# Patient Record
Sex: Male | Born: 1965 | Race: White | Hispanic: No | Smoking: Never smoker
Health system: Southern US, Community
[De-identification: ages and names within clinical notes are randomized; demographics above are authoritative.]

## PROBLEM LIST (undated history)

## (undated) HISTORY — PX: OTHER SURGICAL HISTORY: SHX169

## (undated) HISTORY — PX: HERNIA REPAIR: SHX51

## (undated) HISTORY — PX: CIRCUMCISION: SUR203

---

## 2002-11-05 ENCOUNTER — Ambulatory Visit (HOSPITAL_COMMUNITY): Admission: RE | Admit: 2002-11-05 | Discharge: 2002-11-05 | Payer: Self-pay | Admitting: General Surgery

## 2002-12-17 ENCOUNTER — Emergency Department (HOSPITAL_COMMUNITY): Admission: EM | Admit: 2002-12-17 | Discharge: 2002-12-17 | Payer: Self-pay | Admitting: Emergency Medicine

## 2007-02-06 ENCOUNTER — Emergency Department (HOSPITAL_COMMUNITY): Admission: EM | Admit: 2007-02-06 | Discharge: 2007-02-06 | Payer: Self-pay | Admitting: Emergency Medicine

## 2007-03-13 ENCOUNTER — Ambulatory Visit (HOSPITAL_COMMUNITY): Admission: RE | Admit: 2007-03-13 | Discharge: 2007-03-13 | Payer: Self-pay | Admitting: Family Medicine

## 2010-04-16 ENCOUNTER — Other Ambulatory Visit (HOSPITAL_COMMUNITY): Payer: Self-pay | Admitting: Family Medicine

## 2010-04-16 ENCOUNTER — Ambulatory Visit (HOSPITAL_COMMUNITY)
Admission: RE | Admit: 2010-04-16 | Discharge: 2010-04-16 | Disposition: A | Discharge status: Home / Self Care | Payer: BC Managed Care – PPO | Source: Ambulatory Visit | Attending: Family Medicine | Admitting: Family Medicine

## 2010-04-16 DIAGNOSIS — M25511 Pain in right shoulder: Secondary | ICD-10-CM

## 2010-04-16 DIAGNOSIS — M25519 Pain in unspecified shoulder: Secondary | ICD-10-CM | POA: Insufficient documentation

## 2010-04-16 DIAGNOSIS — M719 Bursopathy, unspecified: Secondary | ICD-10-CM | POA: Insufficient documentation

## 2010-04-16 DIAGNOSIS — M67919 Unspecified disorder of synovium and tendon, unspecified shoulder: Secondary | ICD-10-CM | POA: Insufficient documentation

## 2010-07-09 NOTE — H&P (Signed)
   NAME:  Jacob Marshall, Jacob Marshall NO.:  [id number]   MEDICAL RECORD NO.:  [id number]                  PATIENT TYPE:   LOCATION:                                       FACILITY:   PHYSICIAN:  Dalia Heading, M.D.               DATE OF BIRTH:  [birth year]   DATE OF ADMISSION:  DATE OF DISCHARGE:                                HISTORY & PHYSICAL   CHIEF COMPLAINT:  Change in bowel habits.   HISTORY OF PRESENT ILLNESS:  The patient is a 45 year old white male who is  referred for evaluation and treatment of change in bowel habits.  He has not  had this problem before.  He was constipated for approximately three weeks.  The stool caliber has decreased.  No nausea, vomiting, diarrhea, melena or  hematochezia have been noted.  There is no history of hemorrhoidal disease  or family history of colon carcinoma.   PAST MEDICAL HISTORY:  Extrinsic allergies.   PAST SURGICAL HISTORY:  1. Inguinal herniorrhaphy.  2. Right breast mass removal in the past.   MEDICATIONS:  1. Flonase.  2. Constulose 2 tablespoons p.o. q.h.s.   ALLERGIES:  No known drug allergies.   REVIEW OF SYSTEMS:  Noncontributory.   PHYSICAL EXAMINATION:  GENERAL APPEARANCE:  The patient is a well-developed,  well-nourished white male in no acute distress.  VITAL SIGNS:  He is afebrile, vital signs are stable.  LUNGS:  Clear to auscultation with good breath sounds bilaterally.  HEART:  Regular rate and rhythm without S3, S4 or murmurs.  ABDOMEN:  Soft, nontender and nondistended.  No hepatosplenomegaly, masses  or hernias identified.  RECTAL:  Deferred to the procedure.   IMPRESSION:  Change in bowel habits.    PLAN:  The patient is scheduled for a colonoscopy on November 05, 2002.  The risks and benefits of the procedure including bleeding and perforation  were fully explained to the patient, gained informed consent.                                                Dalia Heading,  M.D.    MAJ/MEDQ  D:  10/22/2002  T:  10/22/2002  Job:  [id number]   cc:   Mila Homer. Sudie Bailey, M.D.  [address] [place], [place] [address]  Fax: [phone]

## 2010-07-28 ENCOUNTER — Encounter: Payer: Self-pay | Admitting: Orthopedic Surgery

## 2010-07-28 ENCOUNTER — Ambulatory Visit (INDEPENDENT_AMBULATORY_CARE_PROVIDER_SITE_OTHER): Payer: BC Managed Care – PPO | Admitting: Orthopedic Surgery

## 2010-07-28 VITALS — Resp 20 | Ht 72.0 in | Wt 127.0 lb

## 2010-07-28 DIAGNOSIS — M958 Other specified acquired deformities of musculoskeletal system: Secondary | ICD-10-CM

## 2010-07-28 DIAGNOSIS — M2180 Other specified acquired deformities of unspecified limb: Secondary | ICD-10-CM

## 2010-07-28 NOTE — Progress Notes (Signed)
Gen. Appearance.  The patient was well-developed and well-nourished grooming and hygiene were normal  Orientation to person place and time normal, mood and affect normal.  Ambulation was normal.  Upper extremities pulses and temperature were normal without edema.  No lymphadenopathy.  Sensation was normal and there were no pathologic reflexes.  Coordination and balance were normal.

## 2010-07-28 NOTE — Progress Notes (Signed)
Note plain films reviewed with the report and they were negative  There was calcific tendinitis but the patient is not symptomatic in this area Subjective:    Jacob Marshall is a 45 y.o. male who presents with right shoulder pain. The symptoms began 1-1/2 years ago. Aggravating factors:sleeping on the RIGHT side.Pain is located in the upper RIGHT periscapular region associated with a feeling that the shoulder is dislocating and there is no is when he moves the arm. Discomfort is described as aching. Symptoms are exacerbated by lying on the shoulder. Evaluation to date: plain films: normal. Therapy to date includes: prednisone oral.  The following portions of the patient's history were reviewed and updated as appropriate: allergies, current medications, past family history, past medical history, past social history and past surgical history.  Review of Systems Gastrointestinal: positive for constipation   Objective:    Resp 20  Ht 6' (1.829 m)  Wt 127 lb (57.607 kg)  BMI 17.22 kg/m2 Right shoulder: normal active ROM, no tenderness, no impingement sign  Left shoulder: normal active ROM, no tenderness, no impingement sign    However, there is crepitance in winging of the RIGHT scapula. Assessment:    Right scapular winging and scapular thoracic bursitis    Plan:    Physical therapy referral.

## 2010-07-28 NOTE — Patient Instructions (Addendum)
Come back in 2 months after scapular winging program

## 2010-09-30 ENCOUNTER — Ambulatory Visit: Payer: BC Managed Care – PPO | Admitting: Orthopedic Surgery

## 2010-12-30 NOTE — H&P (Signed)
NTS SOAP Note  Vital Signs:  Vitals as of: 12/30/2010: Systolic 117: Diastolic 74: Heart Rate 74: Temp 98.54F: Height 72ft 10in: Weight 126Lbs 0 Ounces: Pain Level 1: BMI 18  BMI : 18.08 kg/m2  Subjective: This 45 Years 1 Months old Male presents for of change in bowel movements. Has noted recently to have smaller caliber stools. Associated with abdominal pain. No hematochezia noted. Thinks it may be related to a hernia.  Review of Symptoms:  Constitutional: unremarkable  Head: unremarkable  Eyes:unremarkable  sinus Cardiovascular:unremarkable  Respiratory: unremarkable  Gastrointestin abdominal pain,tenesmus Genitourinary: unremarkable  Musculoskeletal: unremarkable  rash, dry skin Hematolgic/Lymphatic: unremarkable  Allergic/Immunologic:unremarkable    Past Medical History:Reviewed  Past Medical History  Surgical History: Right inguinal hernia, right breast biopsy Medical Problems: none Allergies: nkda Medications: none   Social History: Reviewed   Social History  Preferred Language: English ([place]) Race: White Ethnicity: Not Hispanic / Latino Age: 45 Years 1 Months Marital Status: S Alcohol: No Recreational drug(s): No   Smoking Status: Never smoker reviewed on 12/30/2010  Family History: Reviewed  Family History  Is there a family history of:No family h/o colon cancer Mother: Cancer-stomach    Objective Information:  General: Well appearing, well nourished in no distress.  Head: Atraumatic; no masses; no abnormalities  Throat:  Neck: Supple without lymphadenopathy.  Lungs:CTA bilaterally, no wheezes, rhonchi, rales. Breathing unlabored.  Abdomen: Soft, NT/ND, no HSM, no masses. Weak bilateral inguinal floors, but no hernias appreciated.  GU: unremarkable  deferred to procedure  Assessment: Change in bowel habits, no hernias  Diagnosis & Procedure: DiagnosisCode: 787.99, ProcedureCode: 40981,    Plan:Scheduled for TCS on  01/04/11.   Patient Education: Alternative treatments to surgery were discussed with patient (and family).Risks and benefits of procedure were fully explained to the patient (and family) who gave informed consent. Patient/family questions were addressed.  Follow-up: Pending Surgery

## 2010-12-31 ENCOUNTER — Encounter (HOSPITAL_COMMUNITY): Payer: Self-pay | Admitting: Pharmacy Technician

## 2011-01-03 MED ORDER — SODIUM CHLORIDE 0.45 % IV SOLN
Freq: Once | INTRAVENOUS | Status: AC
  Administered 2011-01-04: 09:00:00 via INTRAVENOUS

## 2011-01-04 ENCOUNTER — Encounter (HOSPITAL_COMMUNITY)
Admission: RE | Disposition: A | Discharge status: Home / Self Care | Payer: Self-pay | Source: Ambulatory Visit | Attending: General Surgery

## 2011-01-04 ENCOUNTER — Ambulatory Visit (HOSPITAL_COMMUNITY)
Admission: RE | Admit: 2011-01-04 | Discharge: 2011-01-04 | Disposition: A | Discharge status: Home / Self Care | Payer: BC Managed Care – PPO | Source: Ambulatory Visit | Attending: General Surgery | Admitting: General Surgery

## 2011-01-04 ENCOUNTER — Encounter (HOSPITAL_COMMUNITY): Payer: Self-pay

## 2011-01-04 DIAGNOSIS — R198 Other specified symptoms and signs involving the digestive system and abdomen: Secondary | ICD-10-CM | POA: Insufficient documentation

## 2011-01-04 DIAGNOSIS — R109 Unspecified abdominal pain: Secondary | ICD-10-CM | POA: Insufficient documentation

## 2011-01-04 HISTORY — PX: COLONOSCOPY: SHX5424

## 2011-01-04 SURGERY — COLONOSCOPY
Anesthesia: Moderate Sedation

## 2011-01-04 MED ORDER — MIDAZOLAM HCL 5 MG/5ML IJ SOLN
INTRAMUSCULAR | Status: DC | PRN
  Administered 2011-01-04: 3 mg via INTRAVENOUS
  Administered 2011-01-04: 1 mg via INTRAVENOUS

## 2011-01-04 MED ORDER — STERILE WATER FOR IRRIGATION IR SOLN
Status: DC | PRN
  Administered 2011-01-04: 09:00:00

## 2011-01-04 MED ORDER — MEPERIDINE HCL 100 MG/ML IJ SOLN
INTRAMUSCULAR | Status: AC
  Filled 2011-01-04: qty 2

## 2011-01-04 MED ORDER — MEPERIDINE HCL 25 MG/ML IJ SOLN
INTRAMUSCULAR | Status: DC | PRN
  Administered 2011-01-04: 25 mg via INTRAVENOUS
  Administered 2011-01-04: 50 mg via INTRAVENOUS

## 2011-01-04 MED ORDER — MIDAZOLAM HCL 5 MG/5ML IJ SOLN
INTRAMUSCULAR | Status: AC
  Filled 2011-01-04: qty 10

## 2011-01-04 NOTE — Interval H&P Note (Signed)
History and Physical Interval Note:   01/04/2011   9:23 AM   Jacob Marshall  has presented today for surgery, with the diagnosis of Change in bowel habits [787.99]  The various methods of treatment have been discussed with the patient and family. After consideration of risks, benefits and other options for treatment, the patient has consented to  Procedure(s): COLONOSCOPY as a surgical intervention .  The patients' history has been reviewed, patient examined, no change in status, stable for surgery.  I have reviewed the patients' chart and labs.  Questions were answered to the patient's satisfaction.     Dalia Heading  MD

## 2011-01-12 ENCOUNTER — Encounter (HOSPITAL_COMMUNITY): Payer: Self-pay | Admitting: General Surgery

## 2016-02-17 ENCOUNTER — Other Ambulatory Visit (HOSPITAL_COMMUNITY): Payer: Self-pay | Admitting: Family Medicine

## 2016-02-17 DIAGNOSIS — N644 Mastodynia: Secondary | ICD-10-CM

## 2016-03-01 ENCOUNTER — Ambulatory Visit (HOSPITAL_COMMUNITY)
Admission: RE | Admit: 2016-03-01 | Discharge: 2016-03-01 | Disposition: A | Discharge status: Home / Self Care | Payer: BLUE CROSS/BLUE SHIELD | Source: Ambulatory Visit | Attending: Family Medicine | Admitting: Family Medicine

## 2016-03-01 DIAGNOSIS — N62 Hypertrophy of breast: Secondary | ICD-10-CM | POA: Insufficient documentation

## 2016-03-01 DIAGNOSIS — N644 Mastodynia: Secondary | ICD-10-CM | POA: Diagnosis not present

## 2017-08-21 ENCOUNTER — Emergency Department (HOSPITAL_COMMUNITY): Payer: Self-pay

## 2017-08-21 ENCOUNTER — Other Ambulatory Visit: Payer: Self-pay

## 2017-08-21 ENCOUNTER — Emergency Department (HOSPITAL_COMMUNITY)
Admission: EM | Admit: 2017-08-21 | Discharge: 2017-08-21 | Disposition: A | Discharge status: Home / Self Care | Payer: Self-pay | Attending: Emergency Medicine | Admitting: Emergency Medicine

## 2017-08-21 ENCOUNTER — Encounter (HOSPITAL_COMMUNITY): Payer: Self-pay | Admitting: Emergency Medicine

## 2017-08-21 DIAGNOSIS — R319 Hematuria, unspecified: Secondary | ICD-10-CM | POA: Insufficient documentation

## 2017-08-21 LAB — URINALYSIS, ROUTINE W REFLEX MICROSCOPIC
Bilirubin Urine: NEGATIVE
Glucose, UA: NEGATIVE mg/dL
Ketones, ur: NEGATIVE mg/dL
Leukocytes, UA: NEGATIVE
Nitrite: NEGATIVE
Protein, ur: 100 mg/dL — AB
RBC / HPF: >50 RBC/hpf — ABNORMAL HIGH (ref 0–5)
Specific Gravity, Urine: 1.023 (ref 1.005–1.030)
pH: 5 (ref 5.0–8.0)

## 2017-08-21 LAB — BASIC METABOLIC PANEL
Anion gap: 5 (ref 5–15)
BUN: 19 mg/dL (ref 6–20)
CHLORIDE: 104 mmol/L (ref 98–111)
CO2: 30 mmol/L (ref 22–32)
CREATININE: 0.88 mg/dL (ref 0.61–1.24)
Calcium: 9.3 mg/dL (ref 8.9–10.3)
Glucose, Bld: 107 mg/dL — ABNORMAL HIGH (ref 70–99)
POTASSIUM: 3.9 mmol/L (ref 3.5–5.1)
Sodium: 139 mmol/L (ref 135–145)

## 2017-08-21 LAB — CBC WITH DIFFERENTIAL/PLATELET
BASOS PCT: 0 %
Basophils Absolute: 0 10*3/uL (ref 0.0–0.1)
EOS ABS: 0.2 10*3/uL (ref 0.0–0.7)
EOS PCT: 3 %
HCT: 42.1 % (ref 39.0–52.0)
HEMOGLOBIN: 14.2 g/dL (ref 13.0–17.0)
Lymphocytes Relative: 22 %
Lymphs Abs: 1.3 10*3/uL (ref 0.7–4.0)
MCH: 31.1 pg (ref 26.0–34.0)
MCHC: 33.7 g/dL (ref 30.0–36.0)
MCV: 92.1 fL (ref 78.0–100.0)
Monocytes Absolute: 0.5 10*3/uL (ref 0.1–1.0)
Monocytes Relative: 8 %
NEUTROS PCT: 67 %
Neutro Abs: 3.9 10*3/uL (ref 1.7–7.7)
PLATELETS: 214 10*3/uL (ref 150–400)
RBC: 4.57 MIL/uL (ref 4.22–5.81)
RDW: 12.8 % (ref 11.5–15.5)
WBC: 5.8 10*3/uL (ref 4.0–10.5)

## 2017-08-21 MED ORDER — CEPHALEXIN 250 MG/5ML PO SUSR: 500.0000 mg | Freq: Four times a day (QID) | ORAL | 0 refills | Status: AC

## 2017-08-21 MED ORDER — CEPHALEXIN 500 MG PO CAPS
500.0000 mg | ORAL_CAPSULE | Freq: Once | ORAL | Status: DC
  Filled 2017-08-21: qty 1

## 2017-08-21 MED ORDER — CEPHALEXIN 250 MG/5ML PO SUSR
500.0000 mg | Freq: Once | ORAL | Status: AC
  Administered 2017-08-21: 500 mg via ORAL
  Filled 2017-08-21: qty 20

## 2017-08-21 MED ORDER — CEPHALEXIN 500 MG PO CAPS: 500.0000 mg | ORAL_CAPSULE | Freq: Four times a day (QID) | ORAL | 0 refills | Status: DC

## 2017-08-21 NOTE — ED Provider Notes (Signed)
[facility] EMERGENCY DEPARTMENT Provider Note   CSN: [id number] Arrival date & time: 08/21/17  1716     History   Chief Complaint Chief Complaint  Patient presents with  . Hematuria    HPI Jacob Marshall is a 52 y.o. male.  HPI   Jacob Marshall is a 52 y.o. male who presents to the Emergency Department complaining of hematuria.  Symptoms began several hours prior to arrival.  States that he noticed blood in his underwear post void, occurred on 2 separate instances.  He denies pain, burning with urination, fever, chills,  hesitancy or increased frequency, back or abdominal pain.  No penile discharge, testicular pain or swelling.  Denies previous kidney stones or similar symptoms.     History reviewed. No pertinent past medical history.  Patient Active Problem List   Diagnosis Date Noted  . Winging of scapula 07/28/2010    Past Surgical History:  Procedure Laterality Date  . CIRCUMCISION     at 52 years old  . COLONOSCOPY  01/04/2011   Procedure: COLONOSCOPY;  Surgeon: Dalia Heading;  Location: [facility];  Service: Gastroenterology;  Laterality: N/A;  . HERNIA REPAIR    . right breast mass          Home Medications    Prior to Admission medications   Medication Sig Start Date End Date Taking? Authorizing Provider  loratadine (CLARITIN) 10 MG tablet Take 10 mg by mouth daily as needed. Allergies     [provider]  Pseudoeph-Doxylamine-DM-APAP (NYQUIL) 60-7.07-20-998 MG/30ML LIQD Take 30 mLs by mouth at bedtime as needed. For cold     [provider]    Family History Family History  Problem Relation Age of Onset  . Lung disease Unknown   . Cancer Unknown     Social History Social History   Tobacco Use  . Smoking status: Never Smoker  . Smokeless tobacco: Never Used  Substance Use Topics  . Alcohol use: No  . Drug use: No     Allergies   Patient has no known allergies.   Review of Systems Review of Systems    Constitutional: Negative for chills, fatigue and fever.  Respiratory: Negative for shortness of breath and wheezing.   Cardiovascular: Negative for chest pain.  Gastrointestinal: Negative for abdominal pain, blood in stool, nausea and vomiting.  Genitourinary: Positive for hematuria. Negative for difficulty urinating, discharge, dysuria, flank pain, frequency, penile swelling, scrotal swelling and testicular pain.  Musculoskeletal: Negative for back pain and myalgias.  Skin: Negative for rash.  Neurological: Negative for dizziness, weakness and numbness.  Hematological: Does not bruise/bleed easily.     Physical Exam Updated Vital Signs BP (!) 145/88 (BP Location: Right Arm)   Pulse 84   Temp 98.2 F (36.8 C) (Oral)   Resp 18   SpO2 99%   Physical Exam  Constitutional: He is oriented to person, place, and time. He appears well-developed and well-nourished. No distress.  Cardiovascular: Normal rate, regular rhythm and intact distal pulses.  Pulmonary/Chest: Effort normal and breath sounds normal. No respiratory distress.  Abdominal: Soft. He exhibits no distension. There is no tenderness. There is no guarding.  Genitourinary: Testes normal and penis normal. No penile tenderness.  Musculoskeletal: Normal range of motion.  Neurological: He is alert and oriented to person, place, and time. No sensory deficit.  Skin: Skin is warm. Capillary refill takes less than 2 seconds. No rash noted.  Psychiatric: He has a normal mood and affect.  Nursing note and vitals reviewed.    ED Treatments / Results  Labs (all labs ordered are listed, but only abnormal results are displayed) Labs Reviewed  URINALYSIS, ROUTINE W REFLEX MICROSCOPIC - Abnormal; Notable for the following components:      Result Value   Color, Urine AMBER (*)    APPearance CLOUDY (*)    Hgb urine dipstick LARGE (*)    Protein, ur 100 (*)    RBC / HPF >50 (*)    Bacteria, UA RARE (*)    All other components within  normal limits  BASIC METABOLIC PANEL - Abnormal; Notable for the following components:   Glucose, Bld 107 (*)    All other components within normal limits  URINE CULTURE  CBC WITH DIFFERENTIAL/PLATELET    EKG None  Radiology Ct Renal Stone Study  Result Date: 08/21/2017 CLINICAL DATA:  Hematuria for 1 day. EXAM: CT ABDOMEN AND PELVIS WITHOUT CONTRAST TECHNIQUE: Multidetector CT imaging of the abdomen and pelvis was performed following the standard protocol without IV contrast. COMPARISON:  None. FINDINGS: Lower chest: The lung bases are clear. No pleural or pericardial effusion. Hepatobiliary: 0.7 cm hypoattenuating lesion in the posterior right hepatic lobe is likely a cyst. The liver is otherwise unremarkable. The gallbladder and biliary tree appear normal. Pancreas: Unremarkable. No pancreatic ductal dilatation or surrounding inflammatory changes. Spleen: Normal in size without focal abnormality. Adrenals/Urinary Tract: 2 nonobstructing right renal stones are identified. The larger measures 0.5 cm. There approximately 4 punctate nonobstructing left renal stones. No hydronephrosis on the right or left. No ureteral stone. A 0.3 cm in diameter calcification is seen in the left side of the bladder which appears to be either at or just beyond the left UVJ. Adrenal glands appear normal. Stomach/Bowel: Stomach is within normal limits. Appendix appears normal. No evidence of bowel wall thickening, distention, or inflammatory changes. Vascular/Lymphatic: No significant vascular findings are present. No enlarged abdominal or pelvic lymph nodes. Reproductive: Mild prostatomegaly is seen. Other: No lymphadenopathy or fluid. Musculoskeletal: Negative. IMPRESSION: 0.3 cm calcification in the left side of the urinary bladder is either at or just beyond the left ureterovesical junction. There is no hydronephrosis on the right or left. Nonobstructing bilateral renal stones. Mild prostatomegaly. Electronically Signed    By: Drusilla Kannerhomas  Dalessio M.D.   On: 08/21/2017 20:17    Procedures Procedures (including critical care time)  Medications Ordered in ED Medications  cephALEXin (KEFLEX) capsule 500 mg (has no administration in time range)     Initial Impression / Assessment and Plan / ED Course  I have reviewed the triage vital signs and the nursing notes.  Pertinent labs & imaging results that were available during my care of the patient were reviewed by me and considered in my medical decision making (see chart for details).     Pt well appearing, vitals reviewed.  Pt with several hours of painless hematuria.  CT neg for ureteral stones.  Exam reassuring.  Urine culture pending.  Will start keflex, pt agrees to close urology f/u.  Return precautions discussed.   Final Clinical Impressions(s) / ED Diagnoses   Final diagnoses:  Painless hematuria    ED Discharge Orders    None       Rosey Bathriplett, Leverne Tessler, PA-C 08/22/17 2147    Margarita Grizzleay, Danielle, MD 08/23/17 [id number]

## 2017-08-21 NOTE — Discharge Instructions (Addendum)
Take the antibiotic as directed until its finished.  Call the urologist listed to arrange a follow-up appointment.  Return to the ER for any worsening symptoms such as fever, vomiting, abdominal or flank pain

## 2017-08-21 NOTE — ED Triage Notes (Addendum)
Pt c/o hematuria x1 today. Pt denies pain. States did have to strain to have bm yesterday and doesn't know if that may have caused any bleeding.

## 2017-08-23 LAB — URINE CULTURE: Culture: <10000 — AB

## 2018-11-20 ENCOUNTER — Emergency Department (HOSPITAL_COMMUNITY): Payer: BC Managed Care – PPO

## 2018-11-20 ENCOUNTER — Emergency Department (HOSPITAL_COMMUNITY)
Admission: EM | Admit: 2018-11-20 | Discharge: 2018-11-20 | Disposition: A | Discharge status: Home / Self Care | Payer: BC Managed Care – PPO | Attending: Emergency Medicine | Admitting: Emergency Medicine

## 2018-11-20 ENCOUNTER — Encounter (HOSPITAL_COMMUNITY): Payer: Self-pay | Admitting: Emergency Medicine

## 2018-11-20 ENCOUNTER — Other Ambulatory Visit: Payer: Self-pay

## 2018-11-20 DIAGNOSIS — K5900 Constipation, unspecified: Secondary | ICD-10-CM | POA: Diagnosis not present

## 2018-11-20 DIAGNOSIS — R197 Diarrhea, unspecified: Secondary | ICD-10-CM | POA: Insufficient documentation

## 2018-11-20 DIAGNOSIS — R1031 Right lower quadrant pain: Secondary | ICD-10-CM | POA: Diagnosis not present

## 2018-11-20 DIAGNOSIS — R109 Unspecified abdominal pain: Secondary | ICD-10-CM | POA: Diagnosis not present

## 2018-11-20 LAB — URINALYSIS, ROUTINE W REFLEX MICROSCOPIC
Bilirubin Urine: NEGATIVE
Glucose, UA: NEGATIVE mg/dL
Ketones, ur: 5 mg/dL — AB
Leukocytes,Ua: NEGATIVE
Nitrite: NEGATIVE
Protein, ur: NEGATIVE mg/dL
Specific Gravity, Urine: 1.018 (ref 1.005–1.030)
pH: 6 (ref 5.0–8.0)

## 2018-11-20 LAB — CBC
HCT: 44.5 % (ref 39.0–52.0)
Hemoglobin: 14.6 g/dL (ref 13.0–17.0)
MCH: 30.4 pg (ref 26.0–34.0)
MCHC: 32.8 g/dL (ref 30.0–36.0)
MCV: 92.7 fL (ref 80.0–100.0)
Platelets: 233 10*3/uL (ref 150–400)
RBC: 4.8 MIL/uL (ref 4.22–5.81)
RDW: 12.3 % (ref 11.5–15.5)
WBC: 4.8 10*3/uL (ref 4.0–10.5)
nRBC: 0 % (ref 0.0–0.2)

## 2018-11-20 LAB — COMPREHENSIVE METABOLIC PANEL
ALT: 18 U/L (ref 0–44)
AST: 18 U/L (ref 15–41)
Albumin: 4.3 g/dL (ref 3.5–5.0)
Alkaline Phosphatase: 33 U/L — ABNORMAL LOW (ref 38–126)
Anion gap: 8 (ref 5–15)
BUN: 18 mg/dL (ref 6–20)
CO2: 27 mmol/L (ref 22–32)
Calcium: 9.2 mg/dL (ref 8.9–10.3)
Chloride: 102 mmol/L (ref 98–111)
Creatinine, Ser: 0.96 mg/dL (ref 0.61–1.24)
GFR calc Af Amer: >60 mL/min (ref >60)
GFR calc non Af Amer: >60 mL/min (ref >60)
Glucose, Bld: 107 mg/dL — ABNORMAL HIGH (ref 70–99)
Potassium: 4.3 mmol/L (ref 3.5–5.1)
Sodium: 137 mmol/L (ref 135–145)
Total Bilirubin: 0.7 mg/dL (ref 0.3–1.2)
Total Protein: 7.2 g/dL (ref 6.5–8.1)

## 2018-11-20 LAB — LIPASE, BLOOD: Lipase: 38 U/L (ref 11–51)

## 2018-11-20 MED ORDER — SODIUM CHLORIDE 0.9% FLUSH: 3.0000 mL | Freq: Once | INTRAVENOUS | Status: DC

## 2018-11-20 MED ORDER — SORBITOL 70 % SOLN
960.0000 mL | TOPICAL_OIL | Freq: Once | ORAL | Status: AC
  Administered 2018-11-20: 960 mL via RECTAL
  Filled 2018-11-20 (×2): qty 473

## 2018-11-20 NOTE — Discharge Instructions (Addendum)
You can take miralax, available over the counter for constipation.

## 2018-11-20 NOTE — ED Triage Notes (Signed)
Pt states for the last week he has felt constipated and having RLQ pain. Pt states the last BM he had yesterday was a thin bm and not normal.

## 2018-11-20 NOTE — ED Provider Notes (Signed)
[facility] EMERGENCY DEPARTMENT Provider Note   CSN: [id number] Arrival date & time: 11/20/18  6644     History   Chief Complaint Chief Complaint  Patient presents with  . Constipation    HPI Jacob Marshall is a 53 y.o. male.     The history is provided by the patient and medical records. No language interpreter was used.  Constipation  Jacob Marshall is a 53 y.o. male who presents to the Emergency Department complaining of constipation. He presents to the emergency department for evaluation of constipation and right lower quadrant abdominal pain. He has a history of chronic constipation, for which she occasionally takes Dulcolax. Over the last month he has experienced progressive of right sided abdominal bloating and discomfort. He feels like there is a blockage in this area. He has worsening constipation over the last week. He has taken Dulcolax twice. After one dose he had liquid stool and yesterday he had a very small caliber stool. He denies any fevers, nausea, vomiting. He has a history of right inguinal hernia repair. He has had to prior colonoscopies by Dr. Arnoldo Morale in [place] but does not have a gastroenterologist. Symptoms are mild to moderate and constant nature. He is planning a trip to [place] next week and wanted to get checked out before going. History reviewed. No pertinent past medical history.  Patient Active Problem List   Diagnosis Date Noted  . Winging of scapula 07/28/2010    Past Surgical History:  Procedure Laterality Date  . CIRCUMCISION     at 53 years old  . COLONOSCOPY  01/04/2011   Procedure: COLONOSCOPY;  Surgeon: Jamesetta So;  Location: [facility];  Service: Gastroenterology;  Laterality: N/A;  . HERNIA REPAIR    . right breast mass          Home Medications    Prior to Admission medications   Medication Sig Start Date End Date Taking? Authorizing Provider  OVER THE COUNTER MEDICATION daily as needed (allergies).  Children's Allergy Medicine   Yes [provider]    Family History Family History  Problem Relation Age of Onset  . Lung disease Other   . Cancer Other     Social History Social History   Tobacco Use  . Smoking status: Never Smoker  . Smokeless tobacco: Never Used  Substance Use Topics  . Alcohol use: No  . Drug use: No     Allergies   Patient has no known allergies.   Review of Systems Review of Systems  Gastrointestinal: Positive for constipation.  All other systems reviewed and are negative.    Physical Exam Updated Vital Signs BP 129/84   Pulse 71   Temp 98 F (36.7 C) (Oral)   Resp 14   SpO2 100%   Physical Exam Vitals signs and nursing note reviewed.  Constitutional:      Appearance: He is well-developed.  HENT:     Head: Normocephalic and atraumatic.  Cardiovascular:     Rate and Rhythm: Normal rate and regular rhythm.  Pulmonary:     Effort: Pulmonary effort is normal. No respiratory distress.  Abdominal:     General: There is no distension.     Palpations: Abdomen is soft. There is no mass.     Tenderness: There is no abdominal tenderness. There is no guarding or rebound.     Hernia: No hernia is present.  Musculoskeletal:        General: No swelling or  tenderness.  Skin:    General: Skin is warm and dry.     Capillary Refill: Capillary refill takes less than 2 seconds.  Neurological:     Mental Status: He is alert and oriented to person, place, and time.  Psychiatric:        Behavior: Behavior normal.      ED Treatments / Results  Labs (all labs ordered are listed, but only abnormal results are displayed) Labs Reviewed  COMPREHENSIVE METABOLIC PANEL - Abnormal; Notable for the following components:      Result Value   Glucose, Bld 107 (*)    Alkaline Phosphatase 33 (*)    All other components within normal limits  URINALYSIS, ROUTINE W REFLEX MICROSCOPIC - Abnormal; Notable for the following components:   Hgb urine  dipstick SMALL (*)    Ketones, ur 5 (*)    Bacteria, UA RARE (*)    All other components within normal limits  LIPASE, BLOOD  CBC    EKG None  Radiology Dg Abdomen 1 View  Result Date: 11/20/2018 CLINICAL DATA:  Abdominal pain and constipation for 1 month. EXAM: ABDOMEN - 1 VIEW COMPARISON:  None. FINDINGS: The bowel gas pattern is normal. No radio-opaque calculi or other significant radiographic abnormality are seen. IMPRESSION: Negative. Electronically Signed   By: Harmon Pier M.D.   On: 11/20/2018 13:19    Procedures Procedures (including critical care time)  Medications Ordered in ED Medications  sodium chloride flush (NS) 0.9 % injection 3 mL (3 mLs Intravenous Not Given 11/20/18 1314)  sorbitol, milk of mag, mineral oil, glycerin (SMOG) enema (960 mLs Rectal Given 11/20/18 1454)     Initial Impression / Assessment and Plan / ED Course  I have reviewed the triage vital signs and the nursing notes.  Pertinent labs & imaging results that were available during my care of the patient were reviewed by me and considered in my medical decision making (see chart for details).        Patient here for evaluation of right sided abdominal discomfort for the last month as well as acute on chronic constipation. He is no evidence of bowel obstruction based on history and examination. Presentation is not consistent with appendicitis. Following enema in the emergency department he had multiple bowel movements and is feeling improved. Discussed with patient home care for constipation. Discussed outpatient follow-up and return precautions.  Final Clinical Impressions(s) / ED Diagnoses   Final diagnoses:  Constipation, unspecified constipation type    ED Discharge Orders    None       Tilden Fossa, MD 11/20/18 [phone]

## 2019-01-01 ENCOUNTER — Other Ambulatory Visit: Payer: Self-pay

## 2019-01-01 DIAGNOSIS — Z20822 Contact with and (suspected) exposure to covid-19: Secondary | ICD-10-CM

## 2019-01-02 ENCOUNTER — Telehealth: Payer: Self-pay | Admitting: *Deleted

## 2019-01-02 LAB — NOVEL CORONAVIRUS, NAA: SARS-CoV-2, NAA: NOT DETECTED

## 2019-01-02 NOTE — Telephone Encounter (Signed)
Seeking covid19 results. None available as of yet.

## 2019-01-03 NOTE — Telephone Encounter (Signed)
Pt given result of COVID test; he verbalized understanding. 

## 2019-08-28 DIAGNOSIS — K5900 Constipation, unspecified: Secondary | ICD-10-CM | POA: Diagnosis not present

## 2019-08-28 DIAGNOSIS — R202 Paresthesia of skin: Secondary | ICD-10-CM | POA: Diagnosis not present

## 2019-08-28 DIAGNOSIS — C44612 Basal cell carcinoma of skin of right upper limb, including shoulder: Secondary | ICD-10-CM | POA: Diagnosis not present

## 2019-09-04 DIAGNOSIS — Z0000 Encounter for general adult medical examination without abnormal findings: Secondary | ICD-10-CM | POA: Diagnosis not present

## 2019-09-04 DIAGNOSIS — E785 Hyperlipidemia, unspecified: Secondary | ICD-10-CM | POA: Diagnosis not present

## 2019-09-11 DIAGNOSIS — R203 Hyperesthesia: Secondary | ICD-10-CM | POA: Diagnosis not present

## 2019-09-11 DIAGNOSIS — Z0001 Encounter for general adult medical examination with abnormal findings: Secondary | ICD-10-CM | POA: Diagnosis not present

## 2019-09-13 ENCOUNTER — Other Ambulatory Visit (HOSPITAL_COMMUNITY): Payer: Self-pay | Admitting: Internal Medicine

## 2019-09-13 ENCOUNTER — Other Ambulatory Visit: Payer: Self-pay | Admitting: Internal Medicine

## 2019-09-13 DIAGNOSIS — I739 Peripheral vascular disease, unspecified: Secondary | ICD-10-CM

## 2019-10-29 DIAGNOSIS — K219 Gastro-esophageal reflux disease without esophagitis: Secondary | ICD-10-CM | POA: Diagnosis not present

## 2019-10-29 DIAGNOSIS — R10813 Right lower quadrant abdominal tenderness: Secondary | ICD-10-CM | POA: Diagnosis not present

## 2019-10-29 DIAGNOSIS — Z1211 Encounter for screening for malignant neoplasm of colon: Secondary | ICD-10-CM | POA: Diagnosis not present

## 2019-10-29 DIAGNOSIS — K5900 Constipation, unspecified: Secondary | ICD-10-CM | POA: Diagnosis not present

## 2020-07-15 DIAGNOSIS — L308 Other specified dermatitis: Secondary | ICD-10-CM | POA: Diagnosis not present

## 2020-07-15 DIAGNOSIS — B078 Other viral warts: Secondary | ICD-10-CM | POA: Diagnosis not present

## 2020-07-15 DIAGNOSIS — L821 Other seborrheic keratosis: Secondary | ICD-10-CM | POA: Diagnosis not present

## 2020-07-26 DIAGNOSIS — Z20822 Contact with and (suspected) exposure to covid-19: Secondary | ICD-10-CM | POA: Diagnosis not present

## 2020-09-16 DIAGNOSIS — E782 Mixed hyperlipidemia: Secondary | ICD-10-CM | POA: Diagnosis not present

## 2020-09-16 DIAGNOSIS — Z0000 Encounter for general adult medical examination without abnormal findings: Secondary | ICD-10-CM | POA: Diagnosis not present

## 2020-09-23 DIAGNOSIS — Z0001 Encounter for general adult medical examination with abnormal findings: Secondary | ICD-10-CM | POA: Diagnosis not present

## 2021-01-28 ENCOUNTER — Other Ambulatory Visit: Payer: Self-pay

## 2021-01-28 ENCOUNTER — Emergency Department (HOSPITAL_COMMUNITY)
Admission: EM | Admit: 2021-01-28 | Discharge: 2021-01-28 | Disposition: A | Discharge status: Home / Self Care | Payer: BC Managed Care – PPO | Attending: Emergency Medicine | Admitting: Emergency Medicine

## 2021-01-28 ENCOUNTER — Emergency Department (HOSPITAL_COMMUNITY): Payer: BC Managed Care – PPO

## 2021-01-28 ENCOUNTER — Encounter (HOSPITAL_COMMUNITY): Payer: Self-pay | Admitting: Emergency Medicine

## 2021-01-28 DIAGNOSIS — R079 Chest pain, unspecified: Secondary | ICD-10-CM | POA: Diagnosis not present

## 2021-01-28 DIAGNOSIS — R0602 Shortness of breath: Secondary | ICD-10-CM | POA: Insufficient documentation

## 2021-01-28 DIAGNOSIS — R0789 Other chest pain: Secondary | ICD-10-CM | POA: Insufficient documentation

## 2021-01-28 LAB — CBC WITH DIFFERENTIAL/PLATELET
Abs Immature Granulocytes: 0.01 10*3/uL (ref 0.00–0.07)
Basophils Absolute: 0.1 10*3/uL (ref 0.0–0.1)
Basophils Relative: 1 %
Eosinophils Absolute: 0.2 10*3/uL (ref 0.0–0.5)
Eosinophils Relative: 4 %
HCT: 42.7 % (ref 39.0–52.0)
Hemoglobin: 14.4 g/dL (ref 13.0–17.0)
Immature Granulocytes: 0 %
Lymphocytes Relative: 27 %
Lymphs Abs: 1.6 10*3/uL (ref 0.7–4.0)
MCH: 31.4 pg (ref 26.0–34.0)
MCHC: 33.7 g/dL (ref 30.0–36.0)
MCV: 93.2 fL (ref 80.0–100.0)
Monocytes Absolute: 0.4 10*3/uL (ref 0.1–1.0)
Monocytes Relative: 8 %
Neutro Abs: 3.5 10*3/uL (ref 1.7–7.7)
Neutrophils Relative %: 60 %
Platelets: 277 10*3/uL (ref 150–400)
RBC: 4.58 MIL/uL (ref 4.22–5.81)
RDW: 12.6 % (ref 11.5–15.5)
WBC: 5.8 10*3/uL (ref 4.0–10.5)
nRBC: 0 % (ref 0.0–0.2)

## 2021-01-28 LAB — COMPREHENSIVE METABOLIC PANEL
ALT: 20 U/L (ref 0–44)
AST: 19 U/L (ref 15–41)
Albumin: 4.6 g/dL (ref 3.5–5.0)
Alkaline Phosphatase: 36 U/L — ABNORMAL LOW (ref 38–126)
Anion gap: 8 (ref 5–15)
BUN: 19 mg/dL (ref 6–20)
CO2: 30 mmol/L (ref 22–32)
Calcium: 9.5 mg/dL (ref 8.9–10.3)
Chloride: 101 mmol/L (ref 98–111)
Creatinine, Ser: 0.81 mg/dL (ref 0.61–1.24)
GFR, Estimated: >60 mL/min (ref >60)
Glucose, Bld: 101 mg/dL — ABNORMAL HIGH (ref 70–99)
Potassium: 3.2 mmol/L — ABNORMAL LOW (ref 3.5–5.1)
Sodium: 139 mmol/L (ref 135–145)
Total Bilirubin: 0.4 mg/dL (ref 0.3–1.2)
Total Protein: 7.5 g/dL (ref 6.5–8.1)

## 2021-01-28 LAB — TROPONIN I (HIGH SENSITIVITY)
Troponin I (High Sensitivity): <2 ng/L (ref <18)
Troponin I (High Sensitivity): <2 ng/L (ref <18)

## 2021-01-28 MED ORDER — ASPIRIN 325 MG PO TABS
325.0000 mg | ORAL_TABLET | Freq: Every day | ORAL | Status: DC
  Filled 2021-01-28: qty 1

## 2021-01-28 MED ORDER — ASPIRIN 81 MG PO CHEW
324.0000 mg | CHEWABLE_TABLET | Freq: Every day | ORAL | Status: DC
  Administered 2021-01-28: 324 mg via ORAL
  Filled 2021-01-28: qty 4

## 2021-01-28 MED ORDER — POTASSIUM CHLORIDE 20 MEQ PO PACK
40.0000 meq | PACK | Freq: Once | ORAL | Status: AC
  Administered 2021-01-28: 40 meq via ORAL
  Filled 2021-01-28: qty 2

## 2021-01-28 MED ORDER — POTASSIUM CHLORIDE CRYS ER 20 MEQ PO TBCR
40.0000 meq | EXTENDED_RELEASE_TABLET | Freq: Once | ORAL | Status: DC
  Filled 2021-01-28: qty 2

## 2021-01-28 NOTE — Discharge Instructions (Addendum)
You have been seen and discharged from the emergency department.  Your cardiac work-up was normal.  However you should still establish care and follow-up with a cardiologist for further nonemergent cardiac testing.  Your potassium level was just slightly low today, you are given oral replacement in the department.  Follow-up with your primary provider for repeat blood work, reevaluation and further care. Take home medications as prescribed. If you have any worsening symptoms, severe chest pain, difficulty breathing or further concerns for your health please return to an emergency department for further evaluation.

## 2021-01-28 NOTE — ED Provider Notes (Signed)
[facility] EMERGENCY DEPARTMENT Provider Note   CSN: [id number] Arrival date & time: 01/28/21  1512     History Chief Complaint  Patient presents with   Chest Pain    Jacob Marshall is a 55 y.o. male.  HPI  55 year old male presents emergency department with concern for left-sided chest discomfort that radiates into his left arm.  Patient states that this has been intermittent for the past 3 days.  Not always exertional.  Relieved when he lays down and rest at night.  He is a non-smoker, no history of hypertension or hyperlipidemia.  Patient describes the discomfort as a nagging like feeling close to his left armpit.  Does not take aspirin daily.  Patient sings as a pastime and has noticed that when he sings he has had some shortness of breath.  No current shortness of breath. Denies any swelling of his lower extremities.  No recent traveling or risk factors for DVT/PE.  Denies any recent fever, cough or illness.  History reviewed. No pertinent past medical history.  Patient Active Problem List   Diagnosis Date Noted   Winging of scapula 07/28/2010    Past Surgical History:  Procedure Laterality Date   CIRCUMCISION     at 55 years old   COLONOSCOPY  01/04/2011   Procedure: COLONOSCOPY;  Surgeon: Jamesetta So;  [facility];  Service: Gastroenterology;  Laterality: N/A;   HERNIA REPAIR     right breast mass         Family History  Problem Relation Age of Onset   Lung disease Other    Cancer Other     Social History   Tobacco Use   Smoking status: Never   Smokeless tobacco: Never  Substance Use Topics   Alcohol use: No   Drug use: No    Home Medications Prior to Admission medications   Medication Sig Start Date End Date Taking? Authorizing Provider  betamethasone dipropionate 0.05 % cream Apply 1 application topically 2 (two) times daily.   Yes [provider]  Chlorphen-Phenyleph-ASA (ALKA-SELTZER PLUS COLD PO) Take 2 tablets by mouth  daily as needed (sinus).   Yes [provider]  dicyclomine (BENTYL) 10 MG/5ML solution Take 20 mg by mouth 4 (four) times daily. 11/04/19  Yes [provider]  diphenhydrAMINE (BENADRYL) 12.5 MG/5ML elixir Take by mouth 4 (four) times daily as needed for allergies. Take 30 ml by mouth as needed for allergies   Yes [provider]  OVER THE COUNTER MEDICATION daily as needed (allergies). Children's Allergy Medicine   Yes [provider]  cetirizine (ZYRTEC) 10 MG tablet 1 tablet as needed Patient not taking: Reported on 01/28/2021    [provider]  lansoprazole (PREVACID SOLUTAB) 30 MG disintegrating tablet 1 tablet Patient not taking: Reported on 01/28/2021 10/29/19   [provider]    Allergies    Patient has no known allergies.  Review of Systems   Review of Systems  Constitutional:  Negative for chills, fatigue and fever.  HENT:  Negative for congestion.   Eyes:  Negative for visual disturbance.  Respiratory:  Positive for chest tightness and shortness of breath. Negative for cough.   Cardiovascular:  Positive for chest pain. Negative for palpitations and leg swelling.  Gastrointestinal:  Negative for abdominal pain, diarrhea and vomiting.  Genitourinary:  Negative for dysuria.  Musculoskeletal:  Negative for back pain.  Skin:  Negative for rash.  Neurological:  Negative for headaches.   Physical  Exam Updated Vital Signs BP (!) 148/90 (BP Location: Right Arm)   Temp 98.2 F (36.8 C) (Oral)   Resp 18   Ht 6' (1.829 m)   Wt 59 kg   SpO2 100%   BMI 17.63 kg/m   Physical Exam Vitals and nursing note reviewed.  Constitutional:      General: He is not in acute distress.    Appearance: Normal appearance. He is not diaphoretic.  HENT:     Head: Normocephalic.     Mouth/Throat:     Mouth: Mucous membranes are moist.  Cardiovascular:     Rate and Rhythm: Normal rate.  Pulmonary:     Effort: Pulmonary effort is normal. No  respiratory distress.  Abdominal:     Palpations: Abdomen is soft.     Tenderness: There is no abdominal tenderness.  Skin:    General: Skin is warm.  Neurological:     Mental Status: He is alert and oriented to person, place, and time. Mental status is at baseline.  Psychiatric:        Mood and Affect: Mood normal.    ED Results / Procedures / Treatments   Labs (all labs ordered are listed, but only abnormal results are displayed) Labs Reviewed  COMPREHENSIVE METABOLIC PANEL - Abnormal; Notable for the following components:      Result Value   Potassium 3.2 (*)    Glucose, Bld 101 (*)    Alkaline Phosphatase 36 (*)    All other components within normal limits  CBC WITH DIFFERENTIAL/PLATELET  TROPONIN I (HIGH SENSITIVITY)  TROPONIN I (HIGH SENSITIVITY)    EKG EKG Interpretation  Date/Time:  Thursday January 28 2021 15:39:35 EST Ventricular Rate:  85 PR Interval:  130 QRS Duration: 95 QT Interval:  381 QTC Calculation: 453 R Axis:   91 Text Interpretation: Sinus rhythm Borderline right axis deviation Nonspecific T abnrm, anterolateral leads NSR, no STEMI Confirmed by Lavenia Atlas [id number]) on 01/28/2021 3:43:05 PM  Radiology DG Chest 2 View  Result Date: 01/28/2021 CLINICAL DATA:  Three days of chest pain EXAM: CHEST - 2 VIEW COMPARISON:  March 13, 2007 FINDINGS: The heart size and mediastinal contours are within normal limits. No focal consolidation. No pleural effusion. No pneumothorax. The visualized skeletal structures are unremarkable. IMPRESSION: No active cardiopulmonary disease. Electronically Signed   By: Dahlia Bailiff M.D.   On: 01/28/2021 16:05    Procedures Procedures   Medications Ordered in ED Medications  aspirin chewable tablet 324 mg (324 mg Oral Given 01/28/21 1549)    ED Course  I have reviewed the triage vital signs and the nursing notes.  Pertinent labs & imaging results that were available during my care of the patient were reviewed by me  and considered in my medical decision making (see chart for details).    MDM Rules/Calculators/A&P                           55 year old male presents emergency department with an achiness in his left chest/armpit area.  This is intermittent, self resolved, nonexertional.  He also has noted some mild shortness of breath when he sings currently while laying in bed he is symptom-free.  Well-appearing.  No significant cardiac history.  EKG is normal sinus rhythm, nonspecific T wave changes but no acute ischemic changes.  No previous to compare to.  Chest x-ray is unremarkable.  Blood work is reassuring, mild hypokalemia.  Troponin is less than 2,  twice with no delta.  Patient's symptoms sound atypical for ACS, work-up is reassuring.  Low heart score of 2.  Lower suspicion for PE/dissection given current presentation and absence of symptoms.  Plan for outpatient cardiology follow-up and nonemergent testing with strict return to ED precautions.  Patient at this time appears safe and stable for discharge and will be treated as an outpatient.  Discharge plan and strict return to ED precautions discussed, patient verbalizes understanding and agreement.  Final Clinical Impression(s) / ED Diagnoses Final diagnoses:  None    Rx / DC Orders ED Discharge Orders     None        Rozelle Logan, DO 01/28/21 2051

## 2021-01-28 NOTE — ED Triage Notes (Signed)
CP and L arm radiation, SOB x 3days. Family h/o MI at early age (uncle 17), nonsmoker, no blood thinner. Denies HLD or HTN. CP is intermittent, better at night, worse with exertion, describes as "nagging". Has not taken nitro or ASA, does not have this prescribed to him

## 2021-02-04 ENCOUNTER — Other Ambulatory Visit: Payer: Self-pay

## 2021-02-04 ENCOUNTER — Telehealth (HOSPITAL_COMMUNITY): Payer: Self-pay | Admitting: Emergency Medicine

## 2021-02-04 ENCOUNTER — Ambulatory Visit (HOSPITAL_COMMUNITY)
Admission: RE | Admit: 2021-02-04 | Discharge: 2021-02-04 | Disposition: A | Discharge status: Home / Self Care | Payer: BC Managed Care – PPO | Source: Ambulatory Visit | Attending: Cardiology | Admitting: Cardiology

## 2021-02-04 ENCOUNTER — Encounter: Payer: Self-pay | Admitting: Cardiology

## 2021-02-04 ENCOUNTER — Ambulatory Visit (INDEPENDENT_AMBULATORY_CARE_PROVIDER_SITE_OTHER): Payer: BC Managed Care – PPO | Admitting: Cardiology

## 2021-02-04 VITALS — BP 110/68 | HR 66 | Ht 72.0 in | Wt 136.8 lb

## 2021-02-04 DIAGNOSIS — R079 Chest pain, unspecified: Secondary | ICD-10-CM

## 2021-02-04 MED ORDER — IOHEXOL 350 MG/ML SOLN
100.0000 mL | Freq: Once | INTRAVENOUS | Status: AC | PRN
  Administered 2021-02-04: 100 mL via INTRAVENOUS

## 2021-02-04 MED ORDER — METOPROLOL TARTRATE 5 MG/5ML IV SOLN
INTRAVENOUS | Status: AC
  Administered 2021-02-04: 10 mg
  Filled 2021-02-04: qty 10

## 2021-02-04 MED ORDER — NITROGLYCERIN 0.4 MG SL SUBL
SUBLINGUAL_TABLET | SUBLINGUAL | Status: AC
  Administered 2021-02-04: 0.8 mg
  Filled 2021-02-04: qty 2

## 2021-02-04 MED ORDER — METOPROLOL TARTRATE 100 MG PO TABS: ORAL_TABLET | ORAL | 0 refills | Status: DC

## 2021-02-04 MED ORDER — METOPROLOL TARTRATE 5 MG/5ML IV SOLN
INTRAVENOUS | Status: AC
  Filled 2021-02-04: qty 5

## 2021-02-04 NOTE — Progress Notes (Signed)
Cardiology Office Note   Date:  02/04/2021   ID:  [name], DOB [birth year], MRN [id number]  PCP:  Benita Stabile, MD  Cardiologist:   Rollene Rotunda, MD Referring:  Benita Stabile, MD  Chief Complaint  Patient presents with   Chest Pain       History of Present Illness: Jacob Marshall is a 55 y.o. male who presents for evaluation of chest pain . He is referred by Benita Stabile, MD  He was in the ED on 12/8 with chest pain.  I reviewed these records for this visit.  There was no objective evidence of ischemia.  Troponin x2 was unremarkable.  EKG demonstrated no significant arrhythmias or ischemic changes.  He says he has been getting chest pain for 2 weeks.  He reports that this is under his left chest and under his left arm.  It comes and goes more with emotional stress.  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  He says it is 5 out of 10.  He goes away if he gets a lot of stress.  He really is not physically active is much to bring on but he does not notice it walking across the shop.  He denies any associated nausea vomiting or diaphoresis.  He might have some mild shortness of breath.  He has not had any palpitations, presyncope or syncope.  He has never had any prior cardiac work-up.  He has not have significant cardiovascular risk factors.   PMH:  None  Past Surgical History:  Procedure Laterality Date   CIRCUMCISION     at 55 years old   COLONOSCOPY  01/04/2011   Procedure: COLONOSCOPY;  Surgeon: Dalia Heading;  [facility];  Service: Gastroenterology;  Laterality: N/A;   HERNIA REPAIR Right    Inguinal   right breast mass       Current Outpatient Medications  Medication Sig Dispense Refill   betamethasone dipropionate 0.05 % cream Apply 1 application topically 2 (two) times daily.     cetirizine (ZYRTEC) 10 MG tablet      Chlorphen-Phenyleph-ASA (ALKA-SELTZER PLUS COLD PO)  Take 2 tablets by mouth daily as needed (sinus).     dicyclomine (BENTYL) 10 MG/5ML solution Take 20 mg by mouth 4 (four) times daily.     diphenhydrAMINE (BENADRYL) 12.5 MG/5ML elixir Take by mouth 4 (four) times daily as needed for allergies. Take 30 ml by mouth as needed for allergies     lansoprazole (PREVACID SOLUTAB) 30 MG disintegrating tablet      OVER THE COUNTER MEDICATION daily as needed (allergies). Children[facility] Allergy Medicine     No current facility-administered medications for this visit.    Allergies:   Patient has no known allergies.    Social History:  The patient  reports that he has never smoked. He has never used smokeless tobacco. He reports that he does not drink alcohol and does not use drugs.   Family History:  The patient's family history includes COPD in his father and sister; Cancer in an other family member; Emphysema in his father; Heart failure in his sister; Lung disease in an other family member; Stomach cancer in his mother; Stroke (age of onset: 79) in his brother.    ROS:  Please see the history of present illness.   Otherwise, review of systems are positive for constipation.  All other systems are reviewed and negative.    PHYSICAL EXAM: VS:  BP 110/68    Pulse 66    Ht 6' (1.829 m)    Wt 136 lb 12.8 oz (62.1 kg)    SpO2 98%    BMI 18.55 kg/m  , BMI Body mass index is 18.55 kg/m.  Blood pressure was equal in both arms GENERAL:  Well appearing.  Thin HEENT:  Pupils equal round and reactive, fundi not visualized, oral mucosa unremarkable NECK:  No jugular venous distention, waveform within normal limits, carotid upstroke brisk and symmetric, no bruits, no thyromegaly LYMPHATICS:  No cervical, inguinal adenopathy LUNGS:  Clear to auscultation bilaterally BACK:  No CVA tenderness CHEST:  Unremarkable HEART:  PMI not displaced or sustained,S1 and S2 within normal limits, no S3, no S4, no clicks, no rubs, no murmurs ABD:  Flat, positive bowel sounds  normal in frequency in pitch, no bruits, no rebound, no guarding, no midline pulsatile mass, no hepatomegaly, no splenomegaly EXT:  2 plus pulses throughout, no edema, no cyanosis no clubbing SKIN:  No rashes no nodules NEURO:  Cranial nerves II through XII grossly intact, motor grossly intact throughout PSYCH:  Cognitively intact, oriented to person place and time    EKG:  EKG is ordered today. The ekg ordered today demonstrates sinus rhythm, rate 66, axis within normal limits, intervals within normal limits, no acute ST-T wave changes.   Recent Labs: 01/28/2021: ALT 20; BUN 19; Creatinine, Ser 0.81; Hemoglobin 14.4; Platelets 277; Potassium 3.2; Sodium 139    Lipid Panel No results found for: CHOL, TRIG, HDL, CHOLHDL, VLDL, LDLCALC, LDLDIRECT    Wt Readings from Last 3 Encounters:  02/04/21 136 lb 12.8 oz (62.1 kg)  01/28/21 130 lb (59 kg)  01/04/11 127 lb (57.6 kg)      Other studies Reviewed: Additional studies/ records that were reviewed today include: ED records. Review of the above records demonstrates:  Please see elsewhere in the note.     ASSESSMENT AND PLAN:  Precordial chest pain: His chest pain has nonanginal but also some anginal features.  I think the pretest probability of obstructive coronary disease is moderate.  I would also like to visualize his aorta.  This is likely noncardiac chest discomfort but we need to screen him.  Hypokalemia: This was supplemented in the emergency room.  He could get this repeated by his primary care physician soon.  Risk reduction: He should have a lipid profile.   Current medicines are reviewed at length with the patient today.  The patient does not have concerns regarding medicines.  The following changes have been made:  no change  Labs/ tests ordered today include:   Orders Placed This Encounter  Procedures   CT CORONARY MORPH W/CTA COR W/SCORE W/CA W/CM &/OR WO/CM   Lipid panel   EKG 12-Lead      Disposition:    FU with me as needed based on the results of the above.     Signed, Rollene Rotunda, MD  02/04/2021 9:38 AM     [facility]

## 2021-02-04 NOTE — Patient Instructions (Addendum)
Medication Instructions:  Your Physician recommend you continue on your current medication as directed.    *If you need a refill on your cardiac medications before your next appointment, please call your pharmacy*   Lab Work: Return for fasting blood work (lipids) If you have labs (blood work) drawn today and your tests are completely normal, you will receive your results only by: MyChart Message (if you have MyChart) OR A paper copy in the mail If you have any lab test that is abnormal or we need to change your treatment, we will call you to review the results.   Testing/Procedures: scheduled for today   Your cardiac CT will be scheduled at one of the below locations:   [facility] [address] [place], [place] [address] [phone]   If scheduled at [facility], please arrive at the [facility] main entrance (entrance A) of [facility] 30 minutes prior to test start time. You can use the FREE valet parking offered at the main entrance (encouraged to control the heart rate for the test) Proceed to the [facility] Radiology Department (first floor) to check-in and test prep.   Please follow these instructions carefully (unless otherwise directed):  Hold all erectile dysfunction medications at least 3 days (72 hrs) prior to test.  On the Night Before the Test: Be sure to Drink plenty of water. Do not consume any caffeinated/decaffeinated beverages or chocolate 12 hours prior to your test. Do not take any antihistamines 12 hours prior to your test. If the patient has contrast allergy: Patient will need a prescription for Prednisone and very clear instructions (as follows): Prednisone 50 mg - take 13 hours prior to test Take another Prednisone 50 mg 7 hours prior to test Take another Prednisone 50 mg 1 hour prior to test Take Benadryl 50 mg 1 hour prior to test Patient must complete all four doses of above prophylactic medications. Patient  will need a ride after test due to Benadryl.  On the Day of the Test: Drink plenty of water until 1 hour prior to the test. Do not eat any food 4 hours prior to the test. You may take your regular medications prior to the test.    After the Test: Drink plenty of water. After receiving IV contrast, you may experience a mild flushed feeling. This is normal. On occasion, you may experience a mild rash up to 24 hours after the test. This is not dangerous. If this occurs, you can take Benadryl 25 mg and increase your fluid intake. If you experience trouble breathing, this can be serious. If it is severe call 911 IMMEDIATELY. If it is mild, please call our office. If you take any of these medications: Glipizide/Metformin, Avandament, Glucavance, please do not take 48 hours after completing test unless otherwise instructed.  Please allow 2-4 weeks for scheduling of routine cardiac CTs. Some insurance companies require a pre-authorization which may delay scheduling of this test.   For non-scheduling related questions, please contact the cardiac imaging nurse navigator should you have any questions/concerns: Rockwell Alexandria, Cardiac Imaging Nurse Navigator Larey Brick, Cardiac Imaging Nurse Navigator [facility] Direct Office Dial: [phone]   For scheduling needs, including cancellations and rescheduling, please call [place], [phone].   Follow-Up: At [facility], you and your health needs are our priority.  As part of our continuing mission to provide you with exceptional heart care, we have created designated Provider Care Teams.  These Care  Teams include your primary Cardiologist (physician) and Advanced Practice Providers (APPs -  Physician Assistants and Nurse Practitioners) who all work together to provide you with the care you need, when you need it.  We recommend signing up for the patient portal called "MyChart".  Sign up information is provided on  this After Visit Summary.  MyChart is used to connect with patients for Virtual Visits (Telemedicine).  Patients are able to view lab/test results, encounter notes, upcoming appointments, etc.  Non-urgent messages can be sent to your provider as well.   To learn more about what you can do with MyChart, go to [email or web address].    Your next appointment:    Based on test results  The format for your next appointment:   In Person  Provider:   Rollene Rotunda, MD

## 2021-02-04 NOTE — Telephone Encounter (Signed)
Calling patient to schedule CCTA per Hochrein. Pt states he is available today  Rockwell Alexandria RN Navigator Cardiac Imaging [facility] [phone] Office  [phone] Cell

## 2021-02-05 ENCOUNTER — Encounter: Payer: Self-pay | Admitting: *Deleted

## 2021-02-05 DIAGNOSIS — R079 Chest pain, unspecified: Secondary | ICD-10-CM | POA: Diagnosis not present

## 2021-02-06 LAB — LIPID PANEL
Chol/HDL Ratio: 4.9 ratio (ref 0.0–5.0)
Cholesterol, Total: 227 mg/dL — ABNORMAL HIGH (ref 100–199)
HDL: 46 mg/dL (ref >39)
LDL Chol Calc (NIH): 163 mg/dL — ABNORMAL HIGH (ref 0–99)
Triglycerides: 99 mg/dL (ref 0–149)
VLDL Cholesterol Cal: 18 mg/dL (ref 5–40)

## 2021-02-10 DIAGNOSIS — K219 Gastro-esophageal reflux disease without esophagitis: Secondary | ICD-10-CM | POA: Diagnosis not present

## 2021-02-10 DIAGNOSIS — F411 Generalized anxiety disorder: Secondary | ICD-10-CM | POA: Diagnosis not present

## 2021-02-10 DIAGNOSIS — L209 Atopic dermatitis, unspecified: Secondary | ICD-10-CM | POA: Diagnosis not present

## 2021-02-10 DIAGNOSIS — R079 Chest pain, unspecified: Secondary | ICD-10-CM | POA: Diagnosis not present

## 2021-02-11 ENCOUNTER — Other Ambulatory Visit: Payer: Self-pay | Admitting: *Deleted

## 2021-02-11 DIAGNOSIS — E7800 Pure hypercholesterolemia, unspecified: Secondary | ICD-10-CM

## 2021-02-11 NOTE — Progress Notes (Signed)
liopid

## 2021-03-10 DIAGNOSIS — F411 Generalized anxiety disorder: Secondary | ICD-10-CM | POA: Diagnosis not present

## 2021-03-10 DIAGNOSIS — K219 Gastro-esophageal reflux disease without esophagitis: Secondary | ICD-10-CM | POA: Diagnosis not present

## 2021-05-23 ENCOUNTER — Encounter (HOSPITAL_COMMUNITY): Payer: Self-pay | Admitting: Emergency Medicine

## 2021-05-23 ENCOUNTER — Emergency Department (HOSPITAL_COMMUNITY)
Admission: EM | Admit: 2021-05-23 | Discharge: 2021-05-23 | Disposition: A | Discharge status: Home / Self Care | Payer: BC Managed Care – PPO | Attending: Emergency Medicine | Admitting: Emergency Medicine

## 2021-05-23 ENCOUNTER — Other Ambulatory Visit: Payer: Self-pay

## 2021-05-23 DIAGNOSIS — R197 Diarrhea, unspecified: Secondary | ICD-10-CM | POA: Diagnosis not present

## 2021-05-23 LAB — CBC
HCT: 41.2 % (ref 39.0–52.0)
Hemoglobin: 13.6 g/dL (ref 13.0–17.0)
MCH: 30.1 pg (ref 26.0–34.0)
MCHC: 33 g/dL (ref 30.0–36.0)
MCV: 91.2 fL (ref 80.0–100.0)
Platelets: 224 10*3/uL (ref 150–400)
RBC: 4.52 MIL/uL (ref 4.22–5.81)
RDW: 12.5 % (ref 11.5–15.5)
WBC: 5.4 10*3/uL (ref 4.0–10.5)
nRBC: 0 % (ref 0.0–0.2)

## 2021-05-23 LAB — COMPREHENSIVE METABOLIC PANEL
ALT: 17 U/L (ref 0–44)
AST: 18 U/L (ref 15–41)
Albumin: 4.3 g/dL (ref 3.5–5.0)
Alkaline Phosphatase: 29 U/L — ABNORMAL LOW (ref 38–126)
Anion gap: 7 (ref 5–15)
BUN: 20 mg/dL (ref 6–20)
CO2: 28 mmol/L (ref 22–32)
Calcium: 9 mg/dL (ref 8.9–10.3)
Chloride: 102 mmol/L (ref 98–111)
Creatinine, Ser: 0.72 mg/dL (ref 0.61–1.24)
GFR, Estimated: >60 mL/min (ref >60)
Glucose, Bld: 100 mg/dL — ABNORMAL HIGH (ref 70–99)
Potassium: 3.4 mmol/L — ABNORMAL LOW (ref 3.5–5.1)
Sodium: 137 mmol/L (ref 135–145)
Total Bilirubin: 0.6 mg/dL (ref 0.3–1.2)
Total Protein: 7.2 g/dL (ref 6.5–8.1)

## 2021-05-23 LAB — LIPASE, BLOOD: Lipase: 38 U/L (ref 11–51)

## 2021-05-23 NOTE — ED Notes (Signed)
Pt unable to give adequate stool sample at this time ?

## 2021-05-23 NOTE — Discharge Instructions (Signed)
You were evaluated in the Emergency Department and after careful evaluation, we did not find any emergent condition requiring admission or further testing in the hospital. ? ?Your exam/testing today was overall reassuring.  Recommend continued use of Imodium as needed.  Recommend follow-up with gastroenterology if symptoms do not improve within the next week. ? ?Please return to the Emergency Department if you experience any worsening of your condition.  Thank you for allowing [place] to be a part of your care. ? ?

## 2021-05-23 NOTE — ED Provider Notes (Signed)
9:29 AM ?Patient in no distress sitting upright speaking clearly.  Labs reviewed.  Trace hypokalemia otherwise reassuring.  We discussed options for ongoing symptom relief, primary care follow-up, patient discharged in stable condition. ?  ?Gerhard Munch, MD ?05/23/21 0930 ? ?

## 2021-05-23 NOTE — ED Provider Notes (Signed)
?[place] DEPT ?[facility] Emergency Department ?Provider Note ?MRN:  [id number]  ?Arrival date & time: 05/23/21    ? ?Chief Complaint   ?Diarrhea ?  ?History of Present Illness   ?Jacob Marshall is a 56 y.o. year-old male with a history of hernia repair presenting to the ED with chief complaint of diarrhea. ? ?8 days of persistent watery foul-smelling diarrhea.  Worse at night, having diarrhea every 20 to 30 minutes during early morning hours, not able to sleep.  Diarrhea started shortly after trying a new supplement called colostrum.  No nausea vomiting or fever, no abdominal pain.  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 ? ?Review of Systems  ?A thorough review of systems was obtained and all systems are negative except as noted in the HPI and PMH.  ? ?Patient's Health History   ?History reviewed. No pertinent past medical history.  ?Past Surgical History:  ?Procedure Laterality Date  ? CIRCUMCISION    ? at 56 years old  ? COLONOSCOPY  01/04/2011  ? Procedure: COLONOSCOPY;  Surgeon: Jamesetta So;  [facility];  Service: Gastroenterology;  Laterality: N/A;  ? HERNIA REPAIR Right   ? Inguinal  ? right breast mass    ?  ?Family History  ?Problem Relation Age of Onset  ? Stomach cancer Mother   ? Emphysema Father   ? COPD Father   ? COPD Sister   ? Heart failure Sister   ?     Related to smoking and lung disease  ? Stroke Brother 39  ? Lung disease Other   ? Cancer Other   ?  ?Social History  ? ?Socioeconomic History  ? Marital status: Divorced  ?  Spouse name: Not on file  ? Number of children: Not on file  ? Years of education: Not on file  ? Highest education level: Not on file  ?Occupational History  ? Occupation: Firefighter  ?  Employer: [employer]  ?Tobacco Use  ? Smoking status: Never  ? Smokeless tobacco: Never  ?Substance and Sexual Activity  ? Alcohol use: No  ? Drug use: No  ? Sexual activity: Not on file  ?Other  Topics Concern  ? Not on file  ?Social History Narrative  ? Lives with girlfriend  ? ?Social Determinants of Health  ? ?Financial Resource Strain: Not on file  ?Food Insecurity: Not on file  ?Transportation Needs: Not on file  ?Physical Activity: Not on file  ?Stress: Not on file  ?Social Connections: Not on file  ?Intimate Partner Violence: Not on file  ?  ? ?Physical Exam  ? ?Vitals:  ? 05/23/21 [id number]  ?BP: 130/84  ?Pulse: 71  ?Resp: 18  ?Temp: 97.8 ?F (36.6 ?C)  ?SpO2: 99%  ?  ?CONSTITUTIONAL: Well-appearing, NAD ?NEURO/PSYCH:  Alert and oriented x 3, no focal deficits ?EYES:  eyes equal and reactive ?ENT/NECK:  no LAD, no JVD ?CARDIO: Regular rate, well-perfused, normal S1 and S2 ?PULM:  CTAB no wheezing or rhonchi ?GI/GU:  non-distended, non-tender ?MSK/SPINE:  No gross deformities, no edema ?SKIN:  no rash, atraumatic ? ? ?*Additional and/or pertinent findings included in MDM below ? ?Diagnostic and Interventional Summary  ? ? EKG Interpretation ? ?Date/Time:    ?Ventricular Rate:    ?PR Interval:    ?QRS Duration:   ?QT Interval:    ?QTC Calculation:   ?R Axis:     ?Text Interpretation:   ?  ? ?  ? ?  Labs Reviewed  ?GASTROINTESTINAL PANEL BY PCR, STOOL (REPLACES STOOL CULTURE)  ?LIPASE, BLOOD  ?COMPREHENSIVE METABOLIC PANEL  ?CBC  ?URINALYSIS, ROUTINE W REFLEX MICROSCOPIC  ?  ?No orders to display  ?  ?Medications - No data to display  ? ?Procedures  /  Critical Care ?Procedures ? ?ED Course and Medical Decision Making  ?Initial Impression and Ddx ?Diarrhea, possibly side effect from recent supplement, which patient has stopped.  Also considering viral cause.  Completely soft and nontender abdomen.  Normal vital signs, well-appearing.  Given the amount of diarrhea will check screening labs to evaluate for electrolyte disturbance.  Anticipating discharge. ? ?Past medical/surgical history that increases complexity of ED encounter: History of hernia repair ? ?Interpretation of Diagnostics ?Awaiting laboratory  assessment. ? ?Patient Reassessment and Ultimate Disposition/Management ?Signed out to oncoming provider. ? ?Patient management required discussion with the following services or consulting groups:  None ? ?Complexity of Problems Addressed ?Acute complicated illness or Injury ? ?Additional Data Reviewed and Analyzed ?Further history obtained from: ?None ? ?Additional Factors Impacting ED Encounter Risk ?None ? ?Barth Kirks. Sedonia Small, MD ?[facility] Emergency Medicine ?[place] [email or web address] ? ?Final Clinical Impressions(s) / ED Diagnoses  ? ?  ICD-10-CM   ?1. Diarrhea, unspecified type  R19.7   ?  ?  ?ED Discharge Orders   ? ? None  ? ?  ?  ? ?Discharge Instructions Discussed with and Provided to Patient:  ? ? ?Discharge Instructions   ? ?  ?You were evaluated in the Emergency Department and after careful evaluation, we did not find any emergent condition requiring admission or further testing in the hospital. ? ?Your exam/testing today was overall reassuring.  Recommend continued use of Imodium as needed.  Recommend follow-up with gastroenterology if symptoms do not improve within the next week. ? ?Please return to the Emergency Department if you experience any worsening of your condition.  Thank you for allowing [place] to be a part of your care. ? ? ? ? ?  ?Maudie Flakes, MD ?05/23/21 [phone] ? ?

## 2021-05-23 NOTE — ED Triage Notes (Signed)
Pt with c/o diarrhea x 8 days. ?

## 2021-05-27 ENCOUNTER — Encounter (INDEPENDENT_AMBULATORY_CARE_PROVIDER_SITE_OTHER): Payer: Self-pay | Admitting: Gastroenterology

## 2021-05-27 ENCOUNTER — Ambulatory Visit (INDEPENDENT_AMBULATORY_CARE_PROVIDER_SITE_OTHER): Payer: BC Managed Care – PPO | Admitting: Gastroenterology

## 2021-05-27 ENCOUNTER — Other Ambulatory Visit (INDEPENDENT_AMBULATORY_CARE_PROVIDER_SITE_OTHER): Payer: Self-pay

## 2021-05-27 ENCOUNTER — Telehealth (INDEPENDENT_AMBULATORY_CARE_PROVIDER_SITE_OTHER): Payer: Self-pay

## 2021-05-27 ENCOUNTER — Encounter (INDEPENDENT_AMBULATORY_CARE_PROVIDER_SITE_OTHER): Payer: Self-pay

## 2021-05-27 DIAGNOSIS — K5909 Other constipation: Secondary | ICD-10-CM | POA: Diagnosis not present

## 2021-05-27 DIAGNOSIS — R197 Diarrhea, unspecified: Secondary | ICD-10-CM | POA: Diagnosis not present

## 2021-05-27 DIAGNOSIS — Z1211 Encounter for screening for malignant neoplasm of colon: Secondary | ICD-10-CM

## 2021-05-27 MED ORDER — CLENPIQ 10-3.5-12 MG-GM -GM/160ML PO SOLN: 1.0000 | Freq: Once | ORAL | 0 refills | Status: AC

## 2021-05-27 NOTE — Telephone Encounter (Signed)
Jacob Marshall, CMA  ?

## 2021-05-27 NOTE — Patient Instructions (Signed)
Schedule colonoscopy in 4 weeks ?Will start  Linzess 72 mcg after colonoscopy ?Perform stool workup ? ? ?

## 2021-05-27 NOTE — Progress Notes (Signed)
Jacob Marshall, M.D. ?Gastroenterology & Hepatology ?[facility] ?[address] ?[place], [place] [address] ?Primary Care Physician: ?Benita Stabile, MD ?[address] ?[place] [address] ? ?Referring MD: PCP ? ?Chief Complaint: diarrhea and abdominal pain ? ?History of Present Illness: ?Jacob Marshall is a 56 y.o. male with past medical history of GERD, who presents for evaluation of diarrhea and abdominal pain. ? ?Patient reports that 12 days ago he presented new onset of watery diarrhea. Reports that he has had mutliple episodes of diarrhea per day, usually after stretching before going to sleep he would have multiple epiosdes of watery non bloody diarrhea.   Prior to the onset of symptoms he tried taking ARMRA bovine colostrum which helped having bowel movements - states he was having 1 bowel movement per day. States after his diarrhea started, he stopped taking this medication. Has tried Imodium once but had severe bloating and abdominal discomfort. States today he has not had a bowel movement. Has tried Miralax in the past without too much improvement. ? ?Has not made any other changes in his diet. ? ?The patient reports that he has had chronic constipation for multiple years (on average 2-3 days but sometimes 1-2 weeks). States that in the past he used to have episodes of constipation. He takes bran on a daily basis. He has taken dicyclomine in the past for episodes of abdominal pain, but has not tried them in thepast. Also has used enemas as needed. ? ?He also reports that he has presented some "puffiness in the groin area".  States that it feels similar to the time that he got diagnosed with a inguinal hernia for which he had to undergo surgery with placement of mesh.  He feels that the hernia is " going through the sides". ? ?The patient denies having any nausea, vomiting, fever, chills, hematochezia, melena, hematemesis, abdominal distention,  abdominal pain, jaundice, pruritus. ? ?Last CHE:NIDPO ?Last Colonoscopy:01/04/2011 - normal ? ?FHx: neg for any gastrointestinal/liver disease, mother stomach cancer  at age 96 ?Social: neg smoking, alcohol or illicit drug use ?Surgical: hernia operation ? ?Past Medical History:History reviewed. No pertinent past medical history. ? ?Past Surgical History: ?Past Surgical History:  ?Procedure Laterality Date  ? CIRCUMCISION    ? at 56 years old  ? COLONOSCOPY  01/04/2011  ? Procedure: COLONOSCOPY;  Surgeon: Dalia Heading;  [facility];  Service: Gastroenterology;  Laterality: N/A;  ? HERNIA REPAIR Right   ? Inguinal  ? right breast mass    ? ? ?Family History: ?Family History  ?Problem Relation Age of Onset  ? Stomach cancer Mother   ? Emphysema Father   ? COPD Father   ? COPD Sister   ? Heart failure Sister   ?     Related to smoking and lung disease  ? Stroke Brother 43  ? Lung disease Other   ? Cancer Other   ? ? ?Social History: ?Social History  ? ?Tobacco Use  ?Smoking Status Never  ?Smokeless Tobacco Never  ? ?Social History  ? ?Substance and Sexual Activity  ?Alcohol Use No  ? ?Social History  ? ?Substance and Sexual Activity  ?Drug Use No  ? ? ?Allergies: ?No Known Allergies ? ?Medications: ?Current Outpatient Medications  ?Medication Sig Dispense Refill  ? betamethasone dipropionate 0.05 % cream Apply 1 application topically 2 (two) times daily.    ? cetirizine (ZYRTEC) 10 MG tablet Take 10 mg by mouth  as needed.    ? Chlorphen-Phenyleph-ASA (ALKA-SELTZER PLUS COLD PO) Take 2 tablets by mouth daily as needed (sinus).    ? diphenhydrAMINE (BENADRYL) 12.5 MG/5ML elixir Take by mouth 4 (four) times daily as needed for allergies. Take 30 ml by mouth as needed for allergies    ? omeprazole (PRILOSEC) 20 MG capsule Take 20 mg by mouth daily.    ? dicyclomine (BENTYL) 10 MG/5ML solution Take 20 mg by mouth 4 (four) times daily. (Patient not taking: Reported on 05/27/2021)    ? ?No current  facility-administered medications for this visit.  ? ? ?Review of Systems: ?GENERAL: negative for malaise, night sweats ?HEENT: No changes in hearing or vision, no nose bleeds or other nasal problems. ?NECK: Negative for lumps, goiter, pain and significant neck swelling ?RESPIRATORY: Negative for cough, wheezing ?CARDIOVASCULAR: Negative for chest pain, leg swelling, palpitations, orthopnea ?GI: SEE HPI ?MUSCULOSKELETAL: Negative for joint pain or swelling, back pain, and muscle pain. ?SKIN: Negative for lesions, rash ?PSYCH: Negative for sleep disturbance, mood disorder and recent psychosocial stressors. ?HEMATOLOGY Negative for prolonged bleeding, bruising easily, and swollen nodes. ?ENDOCRINE: Negative for cold or heat intolerance, polyuria, polydipsia and goiter. ?NEURO: negative for tremor, gait imbalance, syncope and seizures. ?The remainder of the review of systems is noncontributory. ? ? ?Physical Exam: ?BP 124/79 (BP Location: Left Arm, Patient Position: Sitting, Cuff Size: Small)   Pulse 71   Temp 98.3 ?F (36.8 ?C) (Oral)   Ht 6' (1.829 m)   Wt 135 lb (61.2 kg)   BMI 18.31 kg/m?  ?GENERAL: The patient is AO x3, in no acute distress. ?HEENT: Head is normocephalic and atraumatic. EOMI are intact. Mouth is well hydrated and without lesions. ?NECK: Supple. No masses ?LUNGS: Clear to auscultation. No presence of rhonchi/wheezing/rales. Adequate chest expansion ?HEART: RRR, normal s1 and s2. ?ABDOMEN: Soft, nontender, no guarding, no peritoneal signs, and nondistended. BS +. No masses. ?RECTAL/GENITAL EXAM deferred ?EXTREMITIES: Without any cyanosis, clubbing, rash, lesions or edema. ?NEUROLOGIC: AOx3, no focal motor deficit. ?SKIN: no jaundice, no rashes ? ? ?Imaging/Labs: ?as above ? ?I personally reviewed and interpreted the available labs, imaging and endoscopic files. ? ?Impression and Plan: ?Jacob Marshall is a 56 y.o. male with past medical history of GERD, who presents for evaluation of diarrhea and  abdominal pain.  The patient presented acute onset of symptoms for less than 2 weeks.  He used to be constipated prior to this.  It is very possible he had some underlying chronic idiopathic constipation or IBS-C for multiple years but given recent exposure to supplements he may have had new onset diarrhea.  We will also need to rule out infectious causes with stool testing that will be ordered today.  I also advised him to stop taking ARMRA supplements for now which he agreed. ? ?He is due for colorectal cancer screening, we will schedule him for a colonoscopy in 4 weeks once he has recovered from his acute event of diarrhea.  After this he can start taking Linzess 72 mcg every day to avoid recurrent constipation. ? ?- Schedule colonoscopy in 4 weeks ?- Will start  Linzess 72 mcg after colonoscopy ?- Check C. Diff, GI path in stool ? ?All questions were answered.     ? ?Jacob Blazing, MD ?Gastroenterology and Hepatology ?[facility] ? ?

## 2021-06-02 DIAGNOSIS — R197 Diarrhea, unspecified: Secondary | ICD-10-CM | POA: Diagnosis not present

## 2021-06-03 LAB — C. DIFFICILE GDH AND TOXIN A/B
GDH ANTIGEN: NOT DETECTED
MICRO NUMBER:: 13255830
SPECIMEN QUALITY:: ADEQUATE
TOXIN A AND B: NOT DETECTED

## 2021-06-07 ENCOUNTER — Other Ambulatory Visit (INDEPENDENT_AMBULATORY_CARE_PROVIDER_SITE_OTHER): Payer: Self-pay | Admitting: Gastroenterology

## 2021-06-07 ENCOUNTER — Telehealth (INDEPENDENT_AMBULATORY_CARE_PROVIDER_SITE_OTHER): Payer: Self-pay

## 2021-06-07 DIAGNOSIS — K589 Irritable bowel syndrome without diarrhea: Secondary | ICD-10-CM

## 2021-06-07 MED ORDER — DICYCLOMINE HCL 20 MG PO TABS: 20.0000 mg | ORAL_TABLET | Freq: Three times a day (TID) | ORAL | 1 refills | Status: AC | PRN

## 2021-06-07 NOTE — Telephone Encounter (Signed)
Dicyclomine sent to pharmacy

## 2021-06-07 NOTE — Telephone Encounter (Signed)
Patient aware.

## 2021-06-07 NOTE — Telephone Encounter (Signed)
Patient called stating the only thing that helps with his diarrhea is Dicyclomine 20 mg and would like a refill on this sent to [facility]. Please advise.  ?

## 2021-06-30 ENCOUNTER — Encounter (HOSPITAL_COMMUNITY)
Admission: RE | Disposition: A | Discharge status: Home / Self Care | Payer: Self-pay | Source: Home / Self Care | Attending: Gastroenterology

## 2021-06-30 ENCOUNTER — Ambulatory Visit (HOSPITAL_COMMUNITY)
Admission: RE | Admit: 2021-06-30 | Discharge: 2021-06-30 | Disposition: A | Discharge status: Home / Self Care | Payer: BC Managed Care – PPO | Attending: Gastroenterology | Admitting: Gastroenterology

## 2021-06-30 ENCOUNTER — Ambulatory Visit (HOSPITAL_COMMUNITY): Payer: BC Managed Care – PPO | Admitting: Anesthesiology

## 2021-06-30 ENCOUNTER — Other Ambulatory Visit: Payer: Self-pay

## 2021-06-30 ENCOUNTER — Encounter (HOSPITAL_COMMUNITY): Payer: Self-pay | Admitting: Gastroenterology

## 2021-06-30 DIAGNOSIS — K219 Gastro-esophageal reflux disease without esophagitis: Secondary | ICD-10-CM | POA: Diagnosis not present

## 2021-06-30 DIAGNOSIS — Z79899 Other long term (current) drug therapy: Secondary | ICD-10-CM | POA: Insufficient documentation

## 2021-06-30 DIAGNOSIS — Z1211 Encounter for screening for malignant neoplasm of colon: Secondary | ICD-10-CM | POA: Diagnosis not present

## 2021-06-30 HISTORY — PX: COLONOSCOPY WITH PROPOFOL: SHX5780

## 2021-06-30 LAB — HM COLONOSCOPY

## 2021-06-30 SURGERY — COLONOSCOPY WITH PROPOFOL
Anesthesia: General

## 2021-06-30 MED ORDER — PROPOFOL 10 MG/ML IV BOLUS
INTRAVENOUS | Status: DC | PRN
  Administered 2021-06-30: 100 mg via INTRAVENOUS

## 2021-06-30 MED ORDER — PROPOFOL 500 MG/50ML IV EMUL
INTRAVENOUS | Status: DC | PRN
Start: 2021-06-30 — End: 2021-06-30
  Administered 2021-06-30: 200 ug/kg/min via INTRAVENOUS

## 2021-06-30 MED ORDER — LACTATED RINGERS IV SOLN: INTRAVENOUS | Status: DC

## 2021-06-30 MED ORDER — STERILE WATER FOR IRRIGATION IR SOLN
Status: DC | PRN
  Administered 2021-06-30: 50 mL

## 2021-06-30 MED ORDER — LIDOCAINE 2% (20 MG/ML) 5 ML SYRINGE
INTRAMUSCULAR | Status: DC | PRN
Start: 2021-06-30 — End: 2021-06-30
  Administered 2021-06-30: 50 mg via INTRAVENOUS

## 2021-06-30 MED ORDER — PHENYLEPHRINE 80 MCG/ML (10ML) SYRINGE FOR IV PUSH (FOR BLOOD PRESSURE SUPPORT)
PREFILLED_SYRINGE | INTRAVENOUS | Status: DC | PRN
  Administered 2021-06-30: 80 ug via INTRAVENOUS
  Administered 2021-06-30: 160 ug via INTRAVENOUS

## 2021-06-30 NOTE — H&P (Signed)
Jacob Marshall is an 56 y.o. male.   ?Chief Complaint: screening colonoscopy ?HPI: Jacob Marshall is a 56 y.o. male with past medical history of GERD,  coming for screening colonoscopy.  The patient denies having any complaints such as melena, hematochezia, abdominal pain or distention, change in her bowel movement consistency or frequency, no changes in her weight recently.  No family history of colorectal cancer. ? ?Last Colonoscopy:01/04/2011 - normal ? ? ?History reviewed. No pertinent past medical history. ? ?Past Surgical History:  ?Procedure Laterality Date  ? CIRCUMCISION    ? at 56 years old  ? COLONOSCOPY  01/04/2011  ? Procedure: COLONOSCOPY;  Surgeon: Jamesetta So;  [facility];  Service: Gastroenterology;  Laterality: N/A;  ? HERNIA REPAIR Right   ? Inguinal  ? right breast mass    ? ? ?Family History  ?Problem Relation Age of Onset  ? Stomach cancer Mother   ? Emphysema Father   ? COPD Father   ? COPD Sister   ? Heart failure Sister   ?     Related to smoking and lung disease  ? Stroke Brother 67  ? Lung disease Other   ? Cancer Other   ? ?Social History:  reports that he has never smoked. He has never used smokeless tobacco. He reports that he does not drink alcohol and does not use drugs. ? ?Allergies:  ?Allergies  ?Allergen Reactions  ? Sertraline   ?  "Made me mean"  ? ? ?Medications Prior to Admission  ?Medication Sig Dispense Refill  ? acetaminophen (TYLENOL) 325 MG tablet Take 325 mg by mouth every 6 (six) hours as needed for moderate pain.    ? ALPRAZolam (XANAX) 0.5 MG tablet Take 0.25 mg by mouth daily as needed for anxiety.    ? Aspirin-Acetaminophen-Caffeine (GOODY HEADACHE PO) Take 1 packet by mouth daily as needed (headaches).    ? Chlorphen-Phenyleph-ASA (ALKA-SELTZER PLUS COLD PO) Take 2 tablets by mouth daily as needed (sinus).    ? dicyclomine (BENTYL) 20 MG tablet Take 1 tablet (20 mg total) by mouth every 8 (eight) hours as needed (abdominal pain, diarrhea). 100 tablet  1  ? diphenhydrAMINE (BENADRYL) 12.5 MG/5ML elixir Take 75 mg by mouth daily as needed for allergies. Take 30 ml by mouth as needed for allergies    ? fluticasone (FLONASE) 50 MCG/ACT nasal spray Place 1 spray into both nostrils daily as needed for allergies or rhinitis.    ? betamethasone dipropionate 0.05 % cream Apply 1 application. topically 2 (two) times daily as needed (dry skin).    ? Menthol-Methyl Salicylate (SALONPAS PAIN RELIEF PATCH) PTCH Apply 1 patch topically daily as needed (pain).    ? omeprazole (PRILOSEC) 20 MG capsule Take 20 mg by mouth daily.    ? ? ?No results found for this or any previous visit (from the past 48 hour(s)). ?No results found. ? ?Review of Systems  ?Constitutional: Negative.   ?HENT: Negative.    ?Eyes: Negative.   ?Respiratory: Negative.    ?Cardiovascular: Negative.   ?Gastrointestinal: Negative.   ?Endocrine: Negative.   ?Genitourinary: Negative.   ?Musculoskeletal: Negative.   ?Skin: Negative.   ?Allergic/Immunologic: Negative.   ?Neurological: Negative.   ?Hematological: Negative.   ?Psychiatric/Behavioral: Negative.    ? ?Blood pressure (!) 141/79, pulse 83, temperature 98.1 ?F (36.7 ?C), resp. rate 13, height 6' (1.829 m), weight 58.1 kg, SpO2 99 %. ?Physical Exam  ?GENERAL: The patient is AO x3, in no  acute distress. ?HEENT: Head is normocephalic and atraumatic. EOMI are intact. Mouth is well hydrated and without lesions. ?NECK: Supple. No masses ?LUNGS: Clear to auscultation. No presence of rhonchi/wheezing/rales. Adequate chest expansion ?HEART: RRR, normal s1 and s2. ?ABDOMEN: Soft, nontender, no guarding, no peritoneal signs, and nondistended. BS +. No masses. ?EXTREMITIES: Without any cyanosis, clubbing, rash, lesions or edema. ?NEUROLOGIC: AOx3, no focal motor deficit. ?SKIN: no jaundice, no rashes ? ?Assessment/Plan ?Jacob Marshall is a 56 y.o. male with past medical history of GERD,  coming for screening colonoscopy.  The patient is at average risk for  colorectal cancer.  We will proceed with colonoscopy today. ? ? ?Last Colonoscopy:01/04/2011 - normal ? ?Harvel Quale, MD ?06/30/2021, 9:50 AM ? ? ? ?

## 2021-06-30 NOTE — Discharge Instructions (Addendum)
You are being discharged to home.  Resume your previous diet.  Your physician has recommended a repeat colonoscopy in 10 years for screening purposes.  

## 2021-06-30 NOTE — Anesthesia Preprocedure Evaluation (Signed)
Anesthesia Evaluation  ?Patient identified by MRN, date of birth, ID band ?Patient awake ? ? ? ?Reviewed: ?Allergy & Precautions, NPO status , Patient's Chart, lab work & pertinent test results ? ?Airway ?Mallampati: III ? ?TM Distance: >3 FB ?Neck ROM: Full ? ? ? Dental ? ?(+) Lower Dentures, Upper Dentures ?  ?Pulmonary ?neg pulmonary ROS,  ?  ?Pulmonary exam normal ?breath sounds clear to auscultation ? ? ? ? ? ? Cardiovascular ?negative cardio ROS ?Normal cardiovascular exam ?Rhythm:Regular Rate:Normal ? ? ?  ?Neuro/Psych ?negative neurological ROS ? negative psych ROS  ? GI/Hepatic ?negative GI ROS, Neg liver ROS,   ?Endo/Other  ?negative endocrine ROS ? Renal/GU ?negative Renal ROS  ?negative genitourinary ?  ?Musculoskeletal ?negative musculoskeletal ROS ?(+)  ? Abdominal ?  ?Peds ?negative pediatric ROS ?(+)  Hematology ?negative hematology ROS ?(+)   ?Anesthesia Other Findings ?Winging of scapula ?Diarrhea ?Chronic constipation ? ? ? Reproductive/Obstetrics ?negative OB ROS ? ?  ? ? ? ? ? ? ? ? ? ? ? ? ? ?  ?  ? ? ? ? ? ? ? ?Anesthesia Physical ?Anesthesia Plan ? ?ASA: 1 ? ?Anesthesia Plan: General  ? ?Post-op Pain Management: Minimal or no pain anticipated  ? ?Induction: Intravenous ? ?PONV Risk Score and Plan: Propofol infusion ? ?Airway Management Planned:  ? ?Additional Equipment:  ? ?Intra-op Plan:  ? ?Post-operative Plan:  ? ?Informed Consent: I have reviewed the patients History and Physical, chart, labs and discussed the procedure including the risks, benefits and alternatives for the proposed anesthesia with the patient or authorized representative who has indicated his/her understanding and acceptance.  ? ? ? ?Dental advisory given ? ?Plan Discussed with: CRNA and Surgeon ? ?Anesthesia Plan Comments:   ? ? ? ? ? ?Anesthesia Quick Evaluation ? ?

## 2021-06-30 NOTE — Transfer of Care (Signed)
Immediate Anesthesia Transfer of Care Note ? ?Patient: Jacob Marshall ? ?Procedure(s) Performed: COLONOSCOPY WITH PROPOFOL ? ?Patient Location: Endoscopy Unit ? ?Anesthesia Type:MAC ? ?Level of Consciousness: awake, alert , oriented and patient cooperative ? ?Airway & Oxygen Therapy: Patient Spontanous Breathing and Patient connected to nasal cannula oxygen ? ?Post-op Assessment: Report given to RN, Post -op Vital signs reviewed and stable and Patient moving all extremities ? ?Post vital signs: Reviewed and stable ? ?Last Vitals:  ?Vitals Value Taken Time  ?BP 85/58 06/30/21 1030  ?Temp 36.6 ?C 06/30/21 1030  ?Pulse    ?Resp 15 06/30/21 1030  ?SpO2 99 % 06/30/21 1030  ? ? ?Last Pain:  ?Vitals:  ? 06/30/21 1030  ?TempSrc: Oral  ?PainSc: 0-No pain  ?   ? ?Patients Stated Pain Goal: 5 (06/30/21 0914) ? ?Complications: No notable events documented. ?

## 2021-06-30 NOTE — Op Note (Signed)
[facility] ?Patient Name: Jacob LoseRussell Trostle ?Procedure Date: 06/30/2021 9:17 AM ?MRN: [id number] ?Date of Birth: 1966/02/14 ?Attending MD: Katrinka Blazinganiel Castaneda ,  ?CSN: [id number] ?Age: 56 ?Admit Type: Outpatient ?Procedure:                Colonoscopy ?Indications:              Screening for colorectal malignant neoplasm ?Providers:                Katrinka Blazinganiel Castaneda, Nena PolioLisa Moore, RN, Durwin GlazeJay Christopher  ?                          Tech, Technician ?Referring MD:              ?Medicines:                Monitored Anesthesia Care ?Complications:            No immediate complications. ?Estimated Blood Loss:     Estimated blood loss: none. ?Procedure:                Pre-Anesthesia Assessment: ?                          - Prior to the procedure, a History and Physical  ?                          was performed, and patient medications, allergies  ?                          and sensitivities were reviewed. The patient's  ?                          tolerance of previous anesthesia was reviewed. ?                          - The risks and benefits of the procedure and the  ?                          sedation options and risks were discussed with the  ?                          patient. All questions were answered and informed  ?                          consent was obtained. ?                          - ASA Grade Assessment: II - A patient with mild  ?                          systemic disease. ?                          After obtaining informed consent, the colonoscope  ?                          was passed under direct vision. Throughout the  ?  procedure, the patient's blood pressure, pulse, and  ?                          oxygen saturations were monitored continuously. The  ?                          [id number]-HQ190L ([id number]) scope was introduced through  ?                          the anus and advanced to the the cecum, identified  ?                          by appendiceal orifice and ileocecal valve. The  ?                           colonoscopy was performed without difficulty. The  ?                          patient tolerated the procedure well. The quality  ?                          of the bowel preparation was excellent. ?Scope In: 10:01:53 AM ?Scope Out: 10:24:16 AM ?Scope Withdrawal Time: 0 hours 16 minutes 4 seconds  ?Total Procedure Duration: 0 hours 22 minutes 23 seconds  ?Findings: ?     The perianal and digital rectal examinations were normal. ?     The colon (entire examined portion) appeared normal. ?     The retroflexed view of the distal rectum and anal verge was normal and  ?     showed no anal or rectal abnormalities. ?Impression:               - The entire examined colon is normal. ?                          - The distal rectum and anal verge are normal on  ?                          retroflexion view. ?                          - No specimens collected. ?Moderate Sedation: ?     Per Anesthesia Care ?Recommendation:           - Discharge patient to home (ambulatory). ?                          - Resume previous diet. ?                          - Repeat colonoscopy in 10 years for screening  ?                          purposes. ?Procedure Code(s):        --- Professional --- ?  G5003, Colorectal cancer screening; colonoscopy on  ?                          individual not meeting criteria for high risk ?Diagnosis Code(s):        --- Professional --- ?                          Z12.11, Encounter for screening for malignant  ?                          neoplasm of colon ?CPT copyright 2019 [employer]. All rights reserved. ?The codes documented in this report are preliminary and upon coder review may  ?be revised to meet current compliance requirements. ?[name], MD ?[name],  ?06/30/2021 10:32:09 AM ?This report has been signed electronically. ?Number of Addenda: 0 ?

## 2021-06-30 NOTE — Anesthesia Postprocedure Evaluation (Signed)
Anesthesia Post Note ? ?Patient: Jacob Marshall ? ?Procedure(s) Performed: COLONOSCOPY WITH PROPOFOL ? ?Patient location during evaluation: Endoscopy ?Anesthesia Type: General ?Level of consciousness: awake and alert and oriented ?Pain management: pain level controlled ?Vital Signs Assessment: post-procedure vital signs reviewed and stable ?Respiratory status: spontaneous breathing, nonlabored ventilation and respiratory function stable ?Cardiovascular status: blood pressure returned to baseline and stable ?Postop Assessment: no apparent nausea or vomiting ?Anesthetic complications: no ? ? ?No notable events documented. ? ? ?Last Vitals:  ?Vitals:  ? 06/30/21 1030 06/30/21 1036  ?BP: (!) 85/58 104/71  ?Pulse:    ?Resp: 15   ?Temp: 36.6 ?C   ?SpO2: 99%   ?  ?Last Pain:  ?Vitals:  ? 06/30/21 1030  ?TempSrc: Oral  ?PainSc: 0-No pain  ? ? ?  ?  ?  ?  ?  ?  ? ?Geovonni Meyerhoff C Lian Tanori ? ? ? ? ?

## 2021-07-01 ENCOUNTER — Encounter (INDEPENDENT_AMBULATORY_CARE_PROVIDER_SITE_OTHER): Payer: Self-pay | Admitting: *Deleted

## 2021-07-07 ENCOUNTER — Encounter (HOSPITAL_COMMUNITY): Payer: Self-pay | Admitting: Gastroenterology

## 2021-07-14 ENCOUNTER — Encounter (INDEPENDENT_AMBULATORY_CARE_PROVIDER_SITE_OTHER): Payer: Self-pay | Admitting: Gastroenterology

## 2021-08-16 ENCOUNTER — Ambulatory Visit (INDEPENDENT_AMBULATORY_CARE_PROVIDER_SITE_OTHER): Payer: BC Managed Care – PPO | Admitting: Gastroenterology

## 2021-08-16 ENCOUNTER — Encounter (INDEPENDENT_AMBULATORY_CARE_PROVIDER_SITE_OTHER): Payer: Self-pay | Admitting: Gastroenterology

## 2021-08-16 VITALS — BP 106/70 | HR 73 | Temp 97.8°F | Ht 72.0 in | Wt 132.8 lb

## 2021-08-16 DIAGNOSIS — K5909 Other constipation: Secondary | ICD-10-CM

## 2021-08-16 MED ORDER — LUBIPROSTONE 24 MCG PO CAPS: 24.0000 ug | ORAL_CAPSULE | Freq: Two times a day (BID) | ORAL | 1 refills | Status: DC

## 2021-08-26 ENCOUNTER — Ambulatory Visit (INDEPENDENT_AMBULATORY_CARE_PROVIDER_SITE_OTHER): Payer: BC Managed Care – PPO | Admitting: Gastroenterology

## 2021-09-06 ENCOUNTER — Telehealth (INDEPENDENT_AMBULATORY_CARE_PROVIDER_SITE_OTHER): Payer: Self-pay

## 2021-09-06 NOTE — Telephone Encounter (Signed)
I spoke with patient he says he has some discomfort in the hernia repair area. He has between 0-3 bm's per day depending if he is constipated or having loose stools as he says he alternates between constipation and diarrhea.

## 2021-09-06 NOTE — Telephone Encounter (Signed)
Patient called stating he is having issues with watery diarrhea. He says he is taking Amitiza 24 mcg bid. He says he says he has constipation that alternates with diarrhea, with diarrhea being the current problem. He says he has a history of a hernia repair on his right side and at times if he is constipated he feels if he pushes on there area he can go to the rest room right after. He is worried about blockages and something being wrong with the mesh they repaired the hernia with.  Please advise.

## 2021-09-07 NOTE — Telephone Encounter (Signed)
Patient aware of all.

## 2021-09-07 NOTE — Telephone Encounter (Signed)
Should he alternate the Amitiza or continue taking bid ? Please advise.

## 2021-09-22 DIAGNOSIS — E782 Mixed hyperlipidemia: Secondary | ICD-10-CM | POA: Diagnosis not present

## 2021-09-29 DIAGNOSIS — Z0001 Encounter for general adult medical examination with abnormal findings: Secondary | ICD-10-CM | POA: Diagnosis not present

## 2021-10-11 ENCOUNTER — Other Ambulatory Visit (HOSPITAL_COMMUNITY): Payer: Self-pay | Admitting: Nurse Practitioner

## 2021-10-11 ENCOUNTER — Other Ambulatory Visit: Payer: Self-pay | Admitting: Nurse Practitioner

## 2021-10-11 DIAGNOSIS — K4090 Unilateral inguinal hernia, without obstruction or gangrene, not specified as recurrent: Secondary | ICD-10-CM

## 2021-10-15 ENCOUNTER — Ambulatory Visit (HOSPITAL_COMMUNITY)
Admission: RE | Admit: 2021-10-15 | Discharge: 2021-10-15 | Disposition: A | Discharge status: Home / Self Care | Payer: BC Managed Care – PPO | Source: Ambulatory Visit | Attending: Nurse Practitioner | Admitting: Nurse Practitioner

## 2021-10-15 DIAGNOSIS — K4090 Unilateral inguinal hernia, without obstruction or gangrene, not specified as recurrent: Secondary | ICD-10-CM | POA: Diagnosis not present

## 2022-01-18 ENCOUNTER — Other Ambulatory Visit (INDEPENDENT_AMBULATORY_CARE_PROVIDER_SITE_OTHER): Payer: Self-pay | Admitting: Gastroenterology

## 2022-01-18 NOTE — Telephone Encounter (Signed)
Last seen 08/16/21.

## 2022-02-10 ENCOUNTER — Ambulatory Visit (INDEPENDENT_AMBULATORY_CARE_PROVIDER_SITE_OTHER): Payer: BC Managed Care – PPO | Admitting: Gastroenterology

## 2023-11-07 IMAGING — CT CT HEART MORP W/ CTA COR W/ SCORE W/ CA W/CM &/OR W/O CM
4 of 7 series · 8 of 20 positions shown, 9 images · non-contrast
Comparison: None.
COMPARISON: None.

Addendum:
EXAM:
OVER-READ INTERPRETATION  CT CHEST

The following report is an over-read performed by radiologist Dr.
Nanda Kumar Castelino [REDACTED] on 02/04/2021. This
over-read does not include interpretation of cardiac or coronary
anatomy or pathology. The coronary calcium score/coronary CTA
interpretation by the cardiologist is attached.
CLINICAL DATA: 55 Year-old White Male
Cardiac/Coronary  CTA
TECHNIQUE: The patient was scanned on a [employer] scanner.

[Series 6: ts diast sharp · axial · 0.37mm/px · z∈[-204,-165]mm · 2 of 288 slices shown]
[im 96/288  lung]
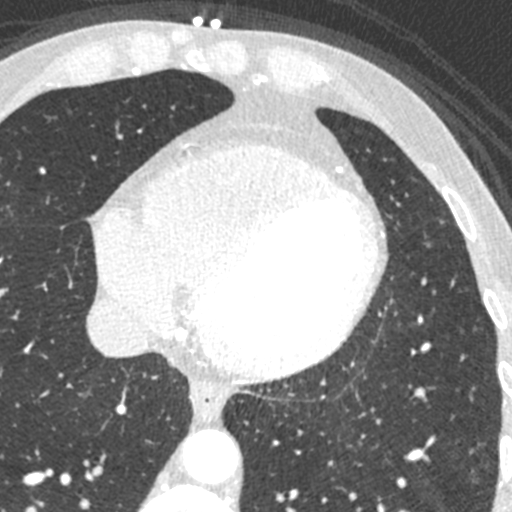
[im 192/288  lung]
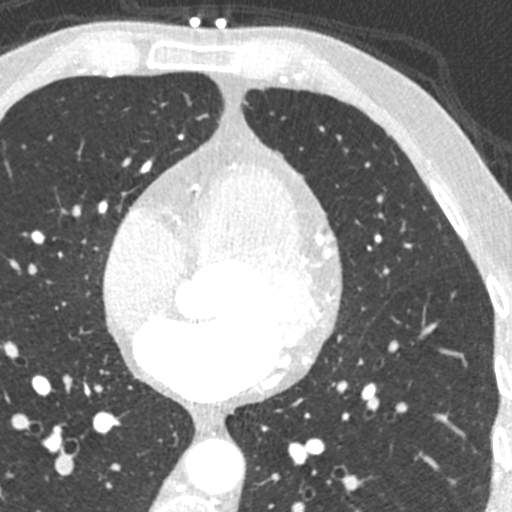

[Series 7: ts syst sharp · axial · 0.37mm/px · z∈[-204,-165]mm · 2 of 288 slices shown]
[im 96/288  lung]
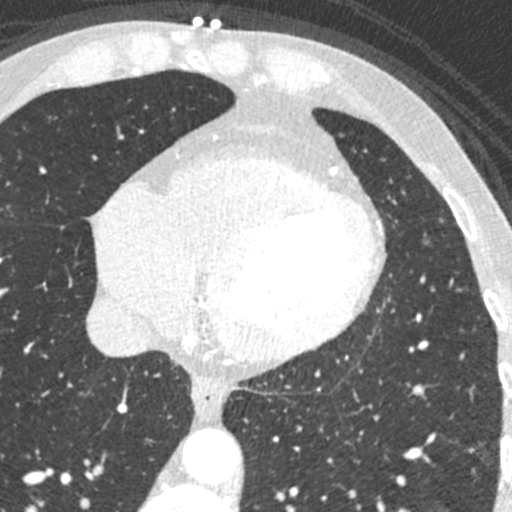
[im 192/288  lung]
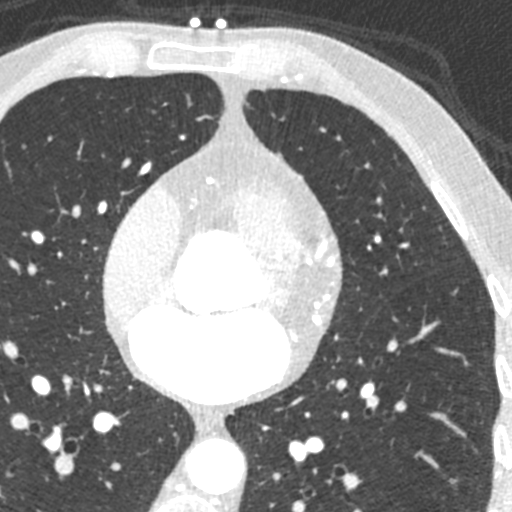

[Series 8: best syst · axial · 0.37mm/px · z∈[-204,-165]mm · 2 of 288 slices shown, 3 images]
[im 96/288  vessel]
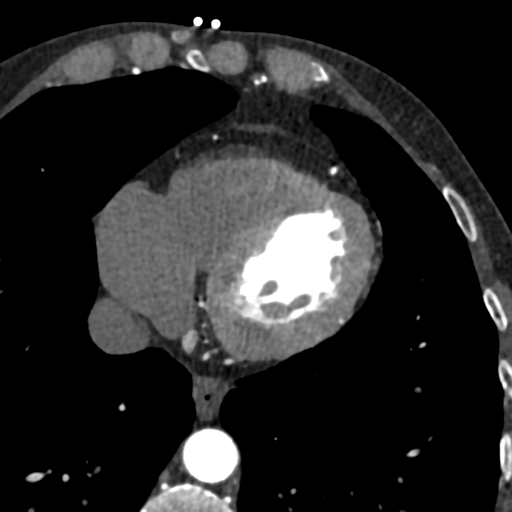
[im 96/288  lung]
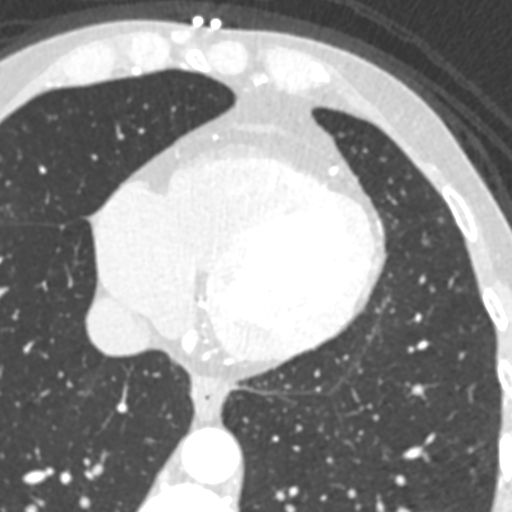
[im 192/288  vessel]
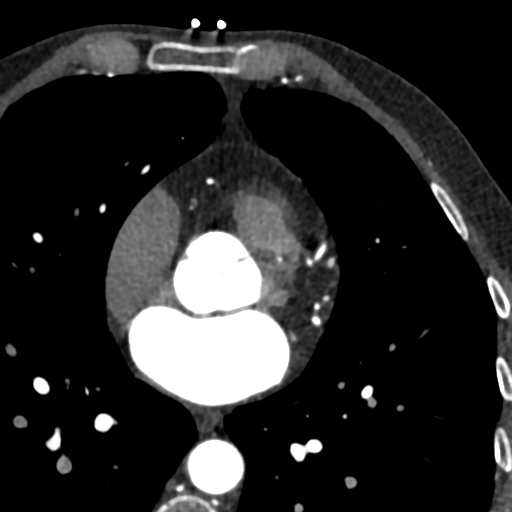

[Series 9: best diast · axial · 0.37mm/px · z∈[-204,-165]mm · 2 of 288 slices shown]
[im 96/288  vessel]
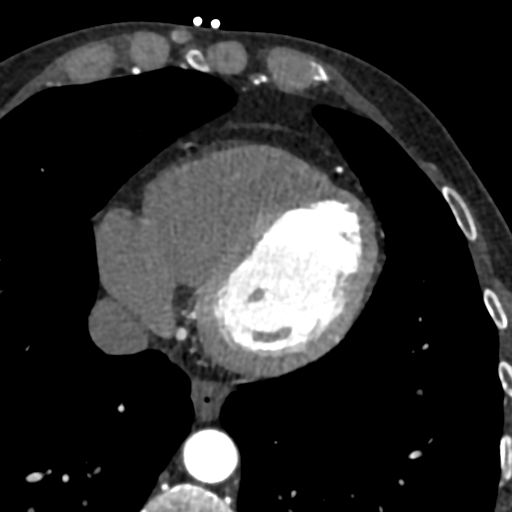
[im 192/288  vessel]
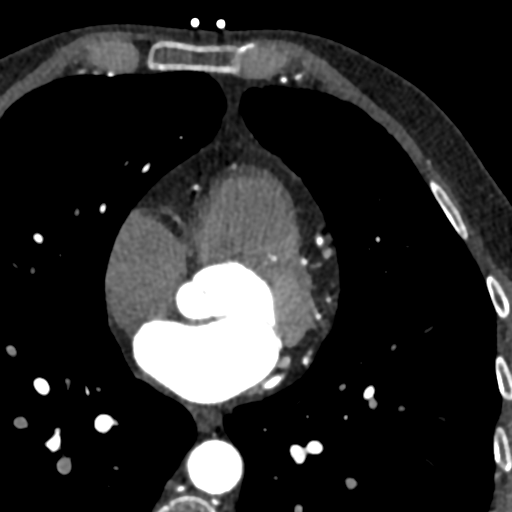

[8 of 20 positions shown; findings below may reference images not displayed]

FINDINGS: Within the visualized portions of the thorax there are no suspicious
appearing pulmonary nodules or masses, there is no acute
consolidative airspace disease, no pleural effusions, no
pneumothorax and no lymphadenopathy. Visualized portions of the
upper abdomen are unremarkable. There are no aggressive appearing
lytic or blastic lesions noted in the visualized portions of the
skeleton.
IMPRESSION: 1. No significant incidental noncardiac findings are noted.
FINDINGS: Scan was triggered in the descending thoracic aorta. Axial
non-contrast 3 mm slices were carried out through the heart. The
data set was analyzed on a dedicated work station and scored using
the Agatson method. Gantry rotation speed was 250 msecs and
collimation was .6 mm. 0.8 mg of sl NTG was given. The 3D data set
was reconstructed in 5% intervals of the 67-82 % of the R-R cycle.
Diastolic phases were analyzed on a dedicated work station using
MPR, MIP and VRT modes. The patient received 100 cc of contrast.

Aorta:  Normal size.  No calcifications.  No dissection.

Main Pulmonary Artery: Normal size of the pulmonary artery.

Aortic Valve:  Tri-leaflet.  No calcifications.

Coronary Arteries:  Normal coronary origin.  Left dominance.

Coronary Calcium Score:

Left main: 0

Left anterior descending artery: 0

Left circumflex artery: 0

Right coronary artery: 0

Total: 0

Percentile: 1st for age, sex, and race matched control.

RCA is a large non-dominant artery there is no significant plaque.

Left main is a large artery that gives rise to LAD and LCX arteries.
Minimal ostial narrowing.

LAD is a large vessel that gives rise to one large D1 Branch. There
is a Mild proximal narrowing (25-49%).

LCX is a non-dominant artery that gives rise to several OM branches.
Myocardial bridge of OM2.

Other findings:

Normal variant pulmonary vein drainage into the left atrium: Left
common pulmonary vein and presence of a right middle pulmonary vein.

Normal left atrial appendage without a thrombus.

Prominent anterior papillary muscle.

Extra-cardiac findings: See attached radiology report for
non-cardiac structures.

Slab artifact noted.
IMPRESSION: 1. Coronary calcium score of 0. This was 1st percentile for age,
sex, and race matched control.

2. Normal coronary origin with left dominance.

3. CAD-RADS 2. Mild non-obstructive CAD (25-49%). Consider
non-atherosclerotic causes of chest pain. Consider preventive
therapy and risk factor modification.

RECOMMENDATIONS:



If CAC = 0, it is reasonable to withhold statin therapy and reassess
in 5 to 10 years, as long as higher risk conditions are absent
(diabetes mellitus, family history of premature CHD in first degree
relatives (males <55 years; females <65 years), cigarette smoking,
LDL >=190 mg/dL or other independent risk factors).

If CAC is 1 to 99, it is reasonable to initiate statin therapy for
patients >=55 years of age.

If CAC is >=100 or >=75th percentile, it is reasonable to initiate
statin therapy at any age.

Cardiology referral should be considered for patients with CAC
scores =400 or >=75th percentile.

*0835 AHA/ACC/AACVPR/AAPA/ABC/KISSINGER/TI/ALLAL/[place]/CARABANTES/LIUBO/PUREVJAW
Guideline on the Management of Blood Cholesterol: A Report of the
American College of Cardiology/American Heart Association Task Force
on Clinical Practice Guidelines. J Am Coll Cardiol.
2925;73(24):7973-7432.

*** End of Addendum ***
EXAM:
OVER-READ INTERPRETATION  CT CHEST

The following report is an over-read performed by radiologist Dr.
Nanda Kumar Castelino [REDACTED] on 02/04/2021. This
over-read does not include interpretation of cardiac or coronary
anatomy or pathology. The coronary calcium score/coronary CTA
interpretation by the cardiologist is attached.
FINDINGS: Within the visualized portions of the thorax there are no suspicious
appearing pulmonary nodules or masses, there is no acute
consolidative airspace disease, no pleural effusions, no
pneumothorax and no lymphadenopathy. Visualized portions of the
upper abdomen are unremarkable. There are no aggressive appearing
lytic or blastic lesions noted in the visualized portions of the
skeleton.
IMPRESSION: 1. No significant incidental noncardiac findings are noted.
